# Patient Record
Sex: Female | Born: 1985
Health system: Southern US, Community
[De-identification: ages and names within clinical notes are randomized; demographics above are authoritative.]

## PROBLEM LIST (undated history)

## (undated) ENCOUNTER — Inpatient Hospital Stay (HOSPITAL_COMMUNITY): Payer: Self-pay

## (undated) DIAGNOSIS — F32A Depression, unspecified: Secondary | ICD-10-CM

## (undated) DIAGNOSIS — D259 Leiomyoma of uterus, unspecified: Secondary | ICD-10-CM

## (undated) DIAGNOSIS — F419 Anxiety disorder, unspecified: Secondary | ICD-10-CM

## (undated) DIAGNOSIS — M419 Scoliosis, unspecified: Secondary | ICD-10-CM

## (undated) HISTORY — DX: Leiomyoma of uterus, unspecified: D25.9

## (undated) HISTORY — DX: Anxiety disorder, unspecified: F41.9

## (undated) HISTORY — DX: Depression, unspecified: F32.A

---

## 2007-04-03 ENCOUNTER — Emergency Department (HOSPITAL_COMMUNITY): Admission: EM | Admit: 2007-04-03 | Discharge: 2007-04-03 | Payer: Self-pay | Admitting: Emergency Medicine

## 2009-06-19 ENCOUNTER — Emergency Department (HOSPITAL_COMMUNITY): Admission: EM | Admit: 2009-06-19 | Discharge: 2009-06-19 | Payer: Self-pay | Admitting: Family Medicine

## 2009-06-25 ENCOUNTER — Emergency Department (HOSPITAL_COMMUNITY): Admission: EM | Admit: 2009-06-25 | Discharge: 2009-06-25 | Payer: Self-pay | Admitting: Family Medicine

## 2009-06-27 ENCOUNTER — Ambulatory Visit: Payer: Self-pay | Admitting: Family Medicine

## 2009-06-27 DIAGNOSIS — M4120 Other idiopathic scoliosis, site unspecified: Secondary | ICD-10-CM | POA: Insufficient documentation

## 2009-06-27 DIAGNOSIS — J301 Allergic rhinitis due to pollen: Secondary | ICD-10-CM | POA: Insufficient documentation

## 2009-07-05 ENCOUNTER — Ambulatory Visit: Payer: Self-pay

## 2009-07-07 ENCOUNTER — Encounter: Admission: RE | Admit: 2009-07-07 | Discharge: 2009-07-07 | Payer: Self-pay | Admitting: Family Medicine

## 2009-07-07 ENCOUNTER — Encounter: Payer: Self-pay | Admitting: Family Medicine

## 2009-08-09 ENCOUNTER — Ambulatory Visit: Payer: Self-pay | Admitting: Family Medicine

## 2009-09-05 ENCOUNTER — Ambulatory Visit: Payer: Self-pay | Admitting: Family Medicine

## 2009-09-05 DIAGNOSIS — M542 Cervicalgia: Secondary | ICD-10-CM | POA: Insufficient documentation

## 2009-09-05 DIAGNOSIS — M62838 Other muscle spasm: Secondary | ICD-10-CM | POA: Insufficient documentation

## 2009-09-13 ENCOUNTER — Encounter: Admission: RE | Admit: 2009-09-13 | Discharge: 2009-11-03 | Payer: Self-pay | Admitting: Family Medicine

## 2009-09-14 ENCOUNTER — Encounter: Payer: Self-pay | Admitting: Family Medicine

## 2009-10-19 ENCOUNTER — Encounter: Payer: Self-pay | Admitting: Family Medicine

## 2009-11-02 ENCOUNTER — Encounter: Payer: Self-pay | Admitting: Family Medicine

## 2010-05-08 NOTE — Letter (Signed)
Summary: [facility] PT referral form  [facility] PT referral form   Imported By: Marily Memos 09/05/2009 13:36:14  _____________________________________________________________________  External Attachment:    Type:   Image     Comment:   External Document

## 2010-05-08 NOTE — Assessment & Plan Note (Signed)
Summary: f/up,tcb   Vital Signs:  Patient profile:   25 year old female Height:      62.5 inches Weight:      94.6 pounds BMI:     17.09 Temp:     98.0 degrees F oral Pulse rate:   60 / minute BP sitting:   120 / 76  (left arm) Cuff size:   regular  Vitals Entered By: Garen Grams LPN (Aug 09, 1608 1:41 PM) CC: ? allergies Is Patient Diabetic? No Pain Assessment Patient in pain? yes     Location: neck/shoulders   CC:  ? allergies.  History of Present Illness: Patient is here today for c/o neck and upper back stiffness and soreness.  She woke up on Sat morn (4 days ago) with muscle spasms in neck and back, no h/o trauma or heavy lifting.  She does have a h/o lumbar scoliosis (seen in sports med clinic). She tried heatback, OTC herbal muscle relaxer and topical anesthetic with minimal relief.    She is also here for f/u on her allergic rhinitis.  She states symptoms improved, but still c/o throat soreness believed to be due to postnasal drip.  She also c/o heartburn and belching which is new for patient - likely symptoms of GER which could also contribute to throat pain.  Habits & Providers  Alcohol-Tobacco-Diet     Tobacco Status: quit  Allergies: No Known Drug Allergies  Social History: Smoking Status:  quit  Review of Systems       The patient complains of hoarseness.  The patient denies anorexia, fever, weight loss, weight gain, decreased hearing, chest pain, dyspnea on exertion, prolonged cough, hemoptysis, abdominal pain, severe indigestion/heartburn, and difficulty walking.    Physical Exam  General:  Well-developed,well-nourished,in no acute distress; alert,appropriate and cooperative throughout examination Ears:  External ear exam shows no significant lesions or deformities.  Otoscopic examination reveals clear canals, tympanic membranes are intact bilaterally without bulging, retraction, inflammation or discharge. Hearing is grossly normal bilaterally. Nose:   External nasal examination shows no deformity or inflammation. Nasal mucosa are pink and moist without lesions or exudates. Mouth:  Oral mucosa and oropharynx without lesions or exudates. Mild erythema of pharynx. Teeth in good repair.  Neck:  No deformities, masses, or tenderness noted. Lungs:  Normal respiratory effort, chest expands symmetrically. Lungs are clear to auscultation, no crackles or wheezes. Heart:  Normal rate and regular rhythm. S1 and S2 normal without gallop, murmur, click, rub or other extra sounds. Msk:  Upper back with muscle spasm of bilat trapezius mm and rhomboids. Tender to palpation.   Impression & Recommendations:  Problem # 1:  MUSCLE SPASM, TRAPEZIUS (ICD-728.85) Will treat with short course of Flexeril. Recommend heat and massage. Orders: [facility]- Est  Level 4 (96045)  Problem # 2:  ESOPHAGEAL REFLUX (ICD-530.81) Could be contributing to throat discomfort.  Will start course of PPI x 8 weeks Her updated medication list for this problem includes:    Omeprazole 20 Mg Cpdr (Omeprazole) ..... One tab by mouth daily  Orders: [facility]- Est  Level 4 (99214)  Problem # 3:  ALLERGIC RHINITIS DUE TO POLLEN (ICD-477.0) Patient states symptoms somewhat improved on Singulair and Fluticasone. Insurance not likely to cover referral to Allergist for eval and injections, but patient will consider this and find out if insurance will cover referral.  Orders: [facility]- Est  Level 4 (40981)  Problem # 4:  SCOLIOSIS , IDIOPATHIC (ICD-737.30) Patient to schedule follow up with Sports  Med and will likely be referred for PT.  Orders: [facility]- Est  Level 4 (16109)  Complete Medication List: 1)  Zyrtec-d Allergy & Congestion 5-120 Mg Xr12h-tab (Cetirizine-pseudoephedrine) .... One tablet by mouth daily 2)  Singulair 10 Mg Tabs (Montelukast sodium) .... One tablet by mouth daily 3)  Neomycin-polymyxin-hc 3.5-10000-1 Susp (Neomycin-polymyxin-hc) 4)  Flonase 50 Mcg/act Susp (Fluticasone  propionate) .... 2 sprays each nostril daily 5)  Anusol-hc 2.5 % Crea (Hydrocortisone) 6)  Omeprazole 20 Mg Cpdr (Omeprazole) .... One tab by mouth daily 7)  Flexeril 5 Mg Tabs (Cyclobenzaprine hcl) .... One tab by mouth three times a day as needed muscle spasm Prescriptions: FLEXERIL 5 MG TABS (CYCLOBENZAPRINE HCL) one tab by mouth three times a day as needed muscle spasm  #30 x 0   Entered and Authorized by:   Jettie Pagan MD   Signed by:   Jettie Pagan MD on 08/09/2009   Method used:   Print then Give to Patient   RxID:   [id number] OMEPRAZOLE 20 MG CPDR (OMEPRAZOLE) one tab by mouth daily  #30 x 1   Entered and Authorized by:   Jettie Pagan MD   Signed by:   Jettie Pagan MD on 08/09/2009   Method used:   Print then Give to Patient   RxID:   [id number]

## 2010-05-08 NOTE — Miscellaneous (Signed)
Summary: PT Discharge/[facility]  PT Discharge/[facility]   Imported By: Lanelle Bal 11/07/2009 14:37:07  _____________________________________________________________________  External Attachment:    Type:   Image     Comment:   External Document

## 2010-05-08 NOTE — Assessment & Plan Note (Signed)
Summary: np,df   Vital Signs:  Patient profile:   25 year old female Height:      62.5 inches Weight:      95 pounds BMI:     17.16 BSA:     1.40 Temp:     98.2 degrees F Pulse rate:   91 / minute BP sitting:   129 / 82  Vitals Entered By: Jone Baseman CMA (June 27, 2009 8:36 AM) CC: New Patient Is Patient Diabetic? No Pain Assessment Patient in pain? no        CC:  New Patient.  History of Present Illness: Patient is here today to establish care and discuss chronic medical issues.  She was dx with Scoliosis at age 89 - never required bracing or limited any activities.  She was active in cheerleading during highschool and participated in sports.  However, since she started her new job, which requires a lot of bending and getting up and down, she has noticed an increase in her back pain.   She also has allergic rhinitis, not adequately controlled on OTC meds (Claritin, Zyrtec, Benadryl, Sudafed).  She started Fluticasone nasal spray 1 week ago, prescribed at urgent care. She has not noticed an improvement in her symptoms.  She also uses a NetiPot daily.  Allergic triggers are seasonal (hay fever). She does have a new pet in the home that could be contributing to her symptoms.  She was also recently treated for Swimmer's ear at urgent care - placed on Neomycin otic drops.  She states her ear pain is resolving.   Patient is also concerned about low weight, BMI 17.  She states she has tried gaining weight with protein shakes, eating more, but has been unable to do so.  Her mother is also very small framed, so this may be genetic. She is also concerned that her small size may contribute to frequent colds.  She had a recent pap in 03/2009 which was normal.  As well as negative GC/Chlam and HIV.  No h/o abnormal paps.  H/o Chlam in 2008 s/p treatment.  Habits & Providers  Alcohol-Tobacco-Diet     Tobacco Status: quit > 6 months     Tobacco Counseling: to remain off tobacco  products  Current Medications (verified): 1)  Zyrtec-D Allergy & Congestion 5-120 Mg Xr12h-Tab (Cetirizine-Pseudoephedrine) .... One Tablet By Mouth Daily 2)  Singulair 10 Mg Tabs (Montelukast Sodium) .... One Tablet By Mouth Daily 3)  Neomycin-Polymyxin-Hc 3.5-10000-1 Susp (Neomycin-Polymyxin-Hc) 4)  Flonase 50 Mcg/act Susp (Fluticasone Propionate) .... 2 Sprays Each Nostril Daily 5)  Anusol-Hc 2.5 % Crea (Hydrocortisone)  Allergies (verified): No Known Drug Allergies  Past History:  Past Medical History: Scoliosis - dx at age 85 Allergic Rhinitis Anal Fissure vs Hemorrhoids - dx 06/2009, resolving per patient Swimmer's Ear - dx 06/2009, resolving  Past Surgical History: None  Family History: Grandmother - died at 37 y/o, DM2, HTN  Social History: Quit smoking 02/2009 Admits to rare THC Denies ETOHSmoking Status:  quit > 6 months  Review of Systems  The patient denies anorexia, fever, hoarseness, chest pain, syncope, prolonged cough, abdominal pain, severe indigestion/heartburn, and unusual weight change.    Physical Exam  General:  Well-developed,well-nourished, thin, in no acute distress; alert,appropriate and cooperative throughout examination Ears:  External ear exam shows no significant lesions or deformities.  Otoscopic examination reveals canals partially occluded by cerumen, no erythema, inflammation or discharge. Hearing is grossly normal bilaterally. Nose:  External nasal examination shows no deformity  or inflammation. Nasal mucosa are boggy, edematous, turbinates bluish and swollen, L > R.  Mouth:  Oral mucosa and oropharynx without lesions or exudates. Pharynx mildly erythematous. Teeth in good repair. Neck:  No deformities, masses, or tenderness noted. Lungs:  Normal respiratory effort, chest expands symmetrically. Lungs are clear to auscultation, no crackles or wheezes. Heart:  Normal rate and regular rhythm. S1 and S2 normal without gallop, murmur, click, rub  or other extra sounds. Abdomen:  Bowel sounds positive,abdomen soft and non-tender without masses, organomegaly or hernias noted.   Impression & Recommendations:  Problem # 1:  ALLERGIC RHINITIS DUE TO POLLEN (ICD-477.0) Continue Fluticasone for 2-3 more weeks. Rx for Zyrtec D.  Will also start trial of Singulair.  If symptoms not improved, will try a different nasal steroid and/or oral antihistamine. Continue NetiPot. Avoid triggers.  Allergies could be contributing to frequent colds and irritated throat from post nasal drip.  Patient may try Echinacea, Zinc and Vitamin C at onset of cold symptoms.   Problem # 2:  SCOLIOSIS , IDIOPATHIC (ICD-737.30) Will refer to Sports Medicine clinic for evaluation.   Problem # 3:  ACUTE SWIMMERS EAR (ICD-380.12) Improving per patient.   Her updated medication list for this problem includes:    Neomycin-polymyxin-hc 3.5-10000-1 Susp (Neomycin-polymyxin-hc)  Problem # 4:  ANAL FISSURE (ICD-565.0) Fissure vs hemorrhoids? Exam deferred as patient states symptoms improving.  Still with some itching from time to time.  Advised to continue high fiber diet and drink lots of water to avoid constipation.  Patient reports 2 BMs daily, not hard or watery.   Complete Medication List: 1)  Zyrtec-d Allergy & Congestion 5-120 Mg Xr12h-tab (Cetirizine-pseudoephedrine) .... One tablet by mouth daily 2)  Singulair 10 Mg Tabs (Montelukast sodium) .... One tablet by mouth daily 3)  Neomycin-polymyxin-hc 3.5-10000-1 Susp (Neomycin-polymyxin-hc) 4)  Flonase 50 Mcg/act Susp (Fluticasone propionate) .... 2 sprays each nostril daily 5)  Anusol-hc 2.5 % Crea (Hydrocortisone)  Patient Instructions: 1)  Please make an appointment with Sports Medicine clinic for evaluation of your scoliosis. 2)  Call me in 1-2 weeks if you notice no improvement with the Fluticasone nasal spray or the start of your new medications. Prescriptions: SINGULAIR 10 MG TABS (MONTELUKAST SODIUM) one  tablet by mouth daily  #30 x 5   Entered and Authorized by:   Jettie Pagan MD   Signed by:   Jettie Pagan MD on 06/27/2009   Method used:   Print then Give to Patient   RxID:   [id number] ALLERGY & CONGESTION 5-120 MG XR12H-TAB (CETIRIZINE-PSEUDOEPHEDRINE) one tablet by mouth daily  #30 x 5   Entered and Authorized by:   Jettie Pagan MD   Signed by:   Jettie Pagan MD on 06/27/2009   Method used:   Print then Give to Patient   RxID:   [id number]

## 2010-05-08 NOTE — Assessment & Plan Note (Signed)
Summary: eval of scoliosis/per drapiza/eo   Vital Signs:  Patient profile:   25 year old female BP sitting:   118 / 56  Vitals Entered By: Lillia Pauls CMA (July 05, 2009 3:06 PM)  History of Present Illness: Stefanie Ortiz has a history of idiopathic scoliosis. No prior history of back trauma, bracing, surgery, or infections. No known history of congenital/musculoskeletal/neurologic disorders.  Has been asymptomatic until she recently began a new job. 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 New job requires prolonged sitting and bending at the waist. Notices non-radiating superior and inferior thoracic spinal pain after prolonged sitting/bending. Pain abates as she continues to ambulate. No pain on general ambulation. No radiation of pain. No extremity paresthesias. No incontinence. Denies neck/shoulder pain.  Allergies: No Known Drug Allergies PMH-FH-SH reviewed for relevance  Physical Exam  General:  Well-developed,well-nourished,in no acute distress; alert,appropriate and cooperative throughout examination Msk:  NECK: FROM without pain. No cervical ttp.  BACK: Dextroscoliosis with increased development of left para spinal musculature.  Markedly excessive lordosis.  No ttp.  Normal ROM at the waist w/o pain.  Normal scapulae function.  PELVIS: Slight antero-inferiorr positioning of left PSIS in comparison to right PSIS. Slightly decreased left SI jt motion.  KNEES: Full ROM/strength.  ANKLES/FEET: Pes planus with excessive pronation and multiple toe splaying. Full ROM/strength.  Pulses:  2+ dp pulses. Extremities:  LEG LENGTHS: LLE appears shorter on ambulation; w/o instablity. Equal leg lengths on supine positioning. LLE longer on sit-up maneuver. Neurologic:  1+ DTR all extremities. (-) sitting/supine SLR bilaterally (-) clonus bilaterally. (-) Babinski bilaterally.   Impression & Recommendations:  Problem  # 1:  SCOLIOSIS , IDIOPATHIC (ICD-737.30)  - Scoliosis x-rays. - Will refer for formal physical therapy once x-rays reviewed.  - RTC in 4-6 wks. - In the meantime, focus on frequent position changes at work given pain occurs at high-load areas of spine on prolonged sitting or bending at the waist.   Orders: Radiology other (Radiology Other)  Complete Medication List: 1)  Zyrtec-d Allergy & Congestion 5-120 Mg Xr12h-tab (Cetirizine-pseudoephedrine) .... One tablet by mouth daily 2)  Singulair 10 Mg Tabs (Montelukast sodium) .... One tablet by mouth daily 3)  Neomycin-polymyxin-hc 3.5-10000-1 Susp (Neomycin-polymyxin-hc) 4)  Flonase 50 Mcg/act Susp (Fluticasone propionate) .... 2 sprays each nostril daily 5)  Anusol-hc 2.5 % Crea (Hydrocortisone)  Appended Document: eval of scoliosis/per drapiza/eo correction leftward scoliosis on examination

## 2010-05-08 NOTE — Miscellaneous (Signed)
Summary: PT Initial Summary/[facility]  PT Initial [facility]   Imported By: Lanelle Bal 09/20/2009 13:48:45  _____________________________________________________________________  External Attachment:    Type:   Image     Comment:   External Document

## 2010-05-08 NOTE — Miscellaneous (Signed)
Summary: Renewal Summary for PT [facility]  Renewal Summary for PT [facility]   Imported By: Maryln Gottron 10/24/2009 15:32:12  _____________________________________________________________________  External Attachment:    Type:   Image     Comment:   External Document

## 2010-05-08 NOTE — Assessment & Plan Note (Signed)
Summary: F/U,[facility]   History of Present Illness: 25 year old female with significant 22 degree lumbar scoliosis presents for follow-up with ongoing upper shoulder pain as well.  Trapezius spasm:  Since her job started, has been having some issues with her back. Regardless of what she is doing with her back, neck and shoulders have really been bothering her.   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 Works at [employer] and will do a --- works for good Engineer, site. Moving around all the time and repetitively bending at work.   Tried some flexeril  Allergies: No Known Drug Allergies  Past History:  Past medical, surgical, family and social histories (including risk factors) reviewed, and no changes noted (except as noted below).  Past Medical History: Reviewed history from 06/27/2009 and no changes required. Scoliosis - dx at age 34 Allergic Rhinitis Anal Fissure vs Hemorrhoids - dx 06/2009, resolving per patient Swimmer's Ear - dx 06/2009, resolving  Past Surgical History: Reviewed history from 06/27/2009 and no changes required. None  Family History: Reviewed history from 06/27/2009 and no changes required. Grandmother - died at 50 y/o, DM2, HTN  Social History: Reviewed history from 06/27/2009 and no changes required. Quit smoking 02/2009 Admits to rare THC Denies ETOH  Review of Systems       REVIEW OF SYSTEMS  GEN: No systemic complaints, no fevers, chills, sweats, or other acute illnesses MSK: Detailed in the HPI GI: tolerating PO intake without difficulty Neuro: No numbness, parasthesias, or tingling associated. Otherwise the pertinent positives of the ROS are noted above.    Physical Exam  General:  GEN: Well-developed,well-nourished,in no acute distress; alert,appropriate and cooperative throughout examination HEENT: Normocephalic and atraumatic without obvious abnormalities. No apparent  alopecia or balding. Ears, externally no deformities PULM: Breathing comfortably in no respiratory distress EXT: No clubbing, cyanosis, or edema PSYCH: Normally interactive. Cooperative during the interview. Pleasant. Friendly and conversant. Not anxious or depressed appearing. Normal, full affect.  Msk:  Upper back with muscle spasm of bilat trapezius mm and rhomboids. Tender to palpation.  Cervical ROM in all directions WNL TTP paracervical, traps, and rhomboids forward sloping scapulas  Notable scoliosis and lordosis  NT at paraspinus, lumbar   Impression & Recommendations:  Problem # 1:  CERVICALGIA (ICD-723.1) Assessment New >25 minutes spent in face to face time with patient, >50% spent in counselling or coordination of care: believe this is all from repetitive postural motions from work with overemphasized posterior cervical support and scapular support.  Patient is weak, <100 pounds. Given HEP and reviewed PT for neck, scapular stability, core program - will need long term continued work with this.  f/u 2-3 mo  Her updated medication list for this problem includes:    Flexeril 5 Mg Tabs (Cyclobenzaprine hcl) ..... One tab by mouth three times a day as needed muscle spasm  Problem # 2:  MUSCLE SPASM, TRAPEZIUS (ICD-728.85) Assessment: New  Problem # 3:  SCOLIOSIS , IDIOPATHIC (ICD-737.30) Films reviewed with the patient.  Complete Medication List: 1)  Zyrtec-d Allergy & Congestion 5-120 Mg Xr12h-tab (Cetirizine-pseudoephedrine) .... One tablet by mouth daily 2)  Singulair 10 Mg Tabs (Montelukast sodium) .... One tablet by mouth daily 3)  Neomycin-polymyxin-hc 3.5-10000-1 Susp (Neomycin-polymyxin-hc) 4)  Flonase 50 Mcg/act Susp (Fluticasone propionate) .... 2 sprays each nostril daily 5)  Anusol-hc 2.5 % Crea (Hydrocortisone) 6)  Omeprazole 20 Mg Cpdr (Omeprazole) .... One  tab by mouth daily 7)  Flexeril 5 Mg Tabs (Cyclobenzaprine hcl) .... One tab by mouth three  times a day as needed muscle spasm

## 2010-07-01 LAB — TSH: TSH: 1.704 u[IU]/mL (ref 0.350–4.500)

## 2010-07-01 LAB — POCT RAPID STREP A (OFFICE): Streptococcus, Group A Screen (Direct): NEGATIVE

## 2011-01-11 LAB — I-STAT 8, (EC8 V) (CONVERTED LAB)
Acid-Base Excess: 2
BUN: 11
Bicarbonate: 26.7 — ABNORMAL HIGH
Chloride: 102
Glucose, Bld: 107 — ABNORMAL HIGH
HCT: 44
Hemoglobin: 15
Operator id: 284141
Potassium: 3.7
Sodium: 134 — ABNORMAL LOW
TCO2: 28
pCO2, Ven: 41.9 — ABNORMAL LOW
pH, Ven: 7.412 — ABNORMAL HIGH

## 2011-01-11 LAB — URINALYSIS, ROUTINE W REFLEX MICROSCOPIC
Glucose, UA: NEGATIVE
Urobilinogen, UA: 1
pH: 6

## 2011-01-11 LAB — CBC
HCT: 38.4
Hemoglobin: 12.3
MCV: 70.1 — ABNORMAL LOW
WBC: 18.4 — ABNORMAL HIGH

## 2011-01-11 LAB — DIFFERENTIAL
Basophils Relative: 0
Eosinophils Absolute: 0
Eosinophils Relative: 0
Lymphocytes Relative: 7 — ABNORMAL LOW
Monocytes Absolute: 2.4 — ABNORMAL HIGH
Neutrophils Relative %: 80 — ABNORMAL HIGH

## 2011-01-11 LAB — POCT I-STAT CREATININE
Creatinine, Ser: 1
Operator id: 284141

## 2011-01-11 LAB — GC/CHLAMYDIA PROBE AMP, GENITAL
Chlamydia, DNA Probe: POSITIVE — AB
GC Probe Amp, Genital: NEGATIVE

## 2011-01-11 LAB — POCT PREGNANCY, URINE: Operator id: 27065

## 2011-05-06 ENCOUNTER — Encounter: Payer: Self-pay | Admitting: Advanced Practice Midwife

## 2012-10-29 ENCOUNTER — Other Ambulatory Visit: Payer: Self-pay | Admitting: Family Medicine

## 2012-10-29 DIAGNOSIS — L989 Disorder of the skin and subcutaneous tissue, unspecified: Secondary | ICD-10-CM

## 2012-11-04 ENCOUNTER — Ambulatory Visit
Admission: RE | Admit: 2012-11-04 | Discharge: 2012-11-04 | Disposition: A | Discharge status: Home / Self Care | Payer: BC Managed Care – PPO | Source: Ambulatory Visit | Attending: Family Medicine | Admitting: Family Medicine

## 2012-11-04 DIAGNOSIS — L989 Disorder of the skin and subcutaneous tissue, unspecified: Secondary | ICD-10-CM

## 2012-11-10 ENCOUNTER — Encounter (INDEPENDENT_AMBULATORY_CARE_PROVIDER_SITE_OTHER): Payer: Self-pay | Admitting: Surgery

## 2012-11-13 ENCOUNTER — Other Ambulatory Visit (HOSPITAL_COMMUNITY)
Admission: RE | Admit: 2012-11-13 | Discharge: 2012-11-13 | Disposition: A | Discharge status: Home / Self Care | Payer: BC Managed Care – PPO | Source: Ambulatory Visit | Attending: Family Medicine | Admitting: Family Medicine

## 2012-11-13 DIAGNOSIS — Z124 Encounter for screening for malignant neoplasm of cervix: Secondary | ICD-10-CM | POA: Insufficient documentation

## 2012-11-16 ENCOUNTER — Other Ambulatory Visit: Payer: Self-pay | Admitting: Family Medicine

## 2012-11-23 ENCOUNTER — Encounter (INDEPENDENT_AMBULATORY_CARE_PROVIDER_SITE_OTHER): Payer: Self-pay | Admitting: Surgery

## 2012-11-23 ENCOUNTER — Ambulatory Visit (INDEPENDENT_AMBULATORY_CARE_PROVIDER_SITE_OTHER): Payer: BC Managed Care – PPO | Admitting: Surgery

## 2012-11-23 ENCOUNTER — Telehealth (INDEPENDENT_AMBULATORY_CARE_PROVIDER_SITE_OTHER): Payer: Self-pay | Admitting: Surgery

## 2012-11-23 VITALS — BP 104/62 | HR 72 | Resp 16 | Ht 63.0 in | Wt 109.2 lb

## 2012-11-23 DIAGNOSIS — R2231 Localized swelling, mass and lump, right upper limb: Secondary | ICD-10-CM

## 2012-11-23 DIAGNOSIS — R229 Localized swelling, mass and lump, unspecified: Secondary | ICD-10-CM

## 2012-11-23 DIAGNOSIS — R2230 Localized swelling, mass and lump, unspecified upper limb: Secondary | ICD-10-CM | POA: Insufficient documentation

## 2012-11-23 NOTE — Progress Notes (Signed)
Patient ID: Stefanie Ortiz, female   DOB: 1985-08-28, 27 y.o.   MRN: [id number]  Chief Complaint  Patient presents with  . Other    arm mass    HPI Stefanie Ortiz is a 27 y.o. female.   HPI This is a very pleasant female referred by Dr.Nnodi for an evaluation of a mass on her right shoulder/deltoid area. The mass is been present for lease 4 or 5 years but is now getting larger and causing significant shoulder discomfort especially with her work and repetitive motions. She will have a dull aching discomfort at the site most of the day. It has gone from minimally palpable 2 more than 5 cm in this time span. She has no paresthesias down the arm. She is otherwise without complaints. History reviewed. No pertinent past medical history.  History reviewed. No pertinent past surgical history.  History reviewed. No pertinent family history.  Social History History  Substance Use Topics  . Smoking status: Former Games developer  . Smokeless tobacco: Never Used  . Alcohol Use: Yes     Comment: rare    No Known Allergies  Current Outpatient Prescriptions  Medication Sig Dispense Refill  . cyclobenzaprine (FLEXERIL) 10 MG tablet Take 10 mg by mouth 3 (three) times daily as needed for muscle spasms.      Marland Kitchen ibuprofen (ADVIL,MOTRIN) 600 MG tablet Take 600 mg by mouth every 6 (six) hours as needed for pain.       No current facility-administered medications for this visit.    Review of Systems Review of Systems  Constitutional: Negative for fever, chills and unexpected weight change.  HENT: Negative for hearing loss, congestion, sore throat, trouble swallowing and voice change.   Eyes: Negative for visual disturbance.  Respiratory: Negative for cough and wheezing.   Cardiovascular: Negative for chest pain, palpitations and leg swelling.  Gastrointestinal: Negative for nausea, vomiting, abdominal pain, diarrhea, constipation, blood in stool, abdominal distention and anal bleeding.   Genitourinary: Negative for hematuria, vaginal bleeding and difficulty urinating.  Musculoskeletal: Positive for back pain. Negative for arthralgias.  Skin: Negative for rash and wound.  Neurological: Negative for seizures, syncope and headaches.  Hematological: Negative for adenopathy. Does not bruise/bleed easily.  Psychiatric/Behavioral: Negative for confusion.    Blood pressure 104/62, pulse 72, resp. rate 16, height 5\' 3"  (1.6 m), weight 109 lb 3.2 oz (49.533 kg).  Physical Exam Physical Exam  Constitutional: She is oriented to person, place, and time. She appears well-developed and well-nourished. No distress.  HENT:  Head: Atraumatic.  Right Ear: External ear normal.  Left Ear: External ear normal.  Nose: Nose normal.  Mouth/Throat: No oropharyngeal exudate.  Eyes: Conjunctivae are normal. Pupils are equal, round, and reactive to light. Right eye exhibits no discharge. Left eye exhibits no discharge. No scleral icterus.  Neck: Normal range of motion. Neck supple. No tracheal deviation present.  Cardiovascular: Normal rate, regular rhythm, normal heart sounds and intact distal pulses.   No murmur heard. Pulmonary/Chest: Effort normal and breath sounds normal. No respiratory distress. She has no wheezes.  Musculoskeletal: Normal range of motion. She exhibits no edema and no tenderness.  There is a 5 cm soft, slightly mobile mass at the right anterior/lateral deltoid muscle of the right upper arm   Lymphadenopathy:    She has no cervical adenopathy.    She has no axillary adenopathy.  Neurological: She is alert and oriented to person, place, and time.  Skin: Skin is warm and dry. No rash noted.  She is not diaphoretic. No erythema.  Psychiatric: Her behavior is normal. Judgment normal.    Data Reviewed She has an ultrasound of the shoulder mass demonstrating a 5 cm x 5.3 cm subcutaneous mass  Assessment    5 cm right upper arm/shoulder mass     Plan    As she is  symptomatic with pain and this is getting larger, removal is recommended for both pain control and histologic evaluation to rule out malignancy. I discussed this with her in detail. I discussed the risks of surgery which includes is not limited to bleeding, infection, recurrence, need for further surgery, et Karie Soda. She understands and wishes to proceed. Surgery will be scheduled        Deanie Jupiter A 11/23/2012, 9:25 AM

## 2012-11-23 NOTE — Telephone Encounter (Signed)
Discussed pt financial responsibilities pt will call back to schedule. Placed in pending folder.

## 2015-07-27 ENCOUNTER — Ambulatory Visit (INDEPENDENT_AMBULATORY_CARE_PROVIDER_SITE_OTHER): Payer: BLUE CROSS/BLUE SHIELD | Admitting: Physician Assistant

## 2015-07-27 VITALS — BP 109/67 | HR 90 | Temp 99.4°F | Resp 16 | Ht 63.0 in | Wt 101.0 lb

## 2015-07-27 DIAGNOSIS — J358 Other chronic diseases of tonsils and adenoids: Secondary | ICD-10-CM | POA: Diagnosis not present

## 2015-07-27 DIAGNOSIS — H6123 Impacted cerumen, bilateral: Secondary | ICD-10-CM

## 2015-07-27 DIAGNOSIS — J039 Acute tonsillitis, unspecified: Secondary | ICD-10-CM

## 2015-07-27 LAB — POCT RAPID STREP A (OFFICE): Rapid Strep A Screen: NEGATIVE

## 2015-07-27 MED ORDER — AMOXICILLIN 875 MG PO TABS: 875.0000 mg | ORAL_TABLET | Freq: Two times a day (BID) | ORAL | Status: AC

## 2015-07-27 NOTE — Progress Notes (Signed)
[facility] [address], [place] [address] [phone]  Date:  07/27/2015   Name:  Stefanie Ortiz   DOB:  March 28, 1986   MRN:  [id number]  PCP:  Gavin Pound, MD    Chief Complaint: Sore Throat; Fever; and Ear Pain   History of Present Illness:  This is a 30 y.o. female with PMH allergic rhinitis who is presenting with 3 days of sore throat, fever and bilateral otalgia. Throat pain is worse on the right side. R ear hurting worse than L. Checked temp last night and was 100. Here her temp is 99.4. Having a dry cough. Denies nasal congestion.  Aggravating/alleviating factors: has been taking ibuprofen. Has not taking anything yet today. History of asthma: no History of env allergies: yes, but not taking anything. States she hasn't needed anything the past 3 years. Has noticed her allergies are worse this year and is thinking she may need to start taking an allergy med. 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   Review of Systems:  Review of Systems See HPI  Patient Active Problem List   Diagnosis Date Noted  . Arm mass 11/23/2012  . CERVICALGIA 09/05/2009  . MUSCLE SPASM, TRAPEZIUS 09/05/2009  . ALLERGIC RHINITIS DUE TO POLLEN 06/27/2009  . SCOLIOSIS , IDIOPATHIC 06/27/2009    Prior to Admission medications   Not on File    No Known Allergies  History reviewed. No pertinent past surgical history.  Social History  Substance Use Topics  . Smoking status: Former Research scientist (life sciences)  . Smokeless tobacco: Never Used  . Alcohol Use: Yes     Comment: rare    History reviewed. No pertinent family history.  Medication list has been reviewed and updated.  Physical Examination:  Physical Exam  Constitutional: She is oriented to person, place, and time. She appears well-developed and well-nourished. No distress.  HENT:  Head: Normocephalic and atraumatic.  Right Ear: Hearing, external ear and ear canal normal.  Left Ear: Hearing,  external ear and ear canal normal.  Nose: Nose normal.  Mouth/Throat: Uvula is midline and mucous membranes are normal. Oropharyngeal exudate (right tonsil), posterior oropharyngeal edema (bilateral tonsils 2+) and posterior oropharyngeal erythema present. No tonsillar abscesses.  Bilateral TMs blocked by cerumen. Lavage performed bilaterally. TMs clear.  Eyes: Conjunctivae and lids are normal. Right eye exhibits no discharge. Left eye exhibits no discharge. No scleral icterus.  Cardiovascular: Normal rate, regular rhythm, normal heart sounds and normal pulses.   No murmur heard. Pulmonary/Chest: Effort normal and breath sounds normal. No respiratory distress. She has no wheezes. She has no rhonchi. She has no rales.  Musculoskeletal: Normal range of motion.  Lymphadenopathy:       Head (right side): No submental, no submandibular and no tonsillar adenopathy present.       Head (left side): No submental, no submandibular and no tonsillar adenopathy present.    She has cervical adenopathy (right, anterior).  Neurological: She is alert and oriented to person, place, and time.  Skin: Skin is warm, dry and intact. No lesion and no rash noted.  Psychiatric: She has a normal mood and affect. Her speech is normal and behavior is normal. Thought content normal.   BP 109/67 mmHg  Pulse 90  Temp(Src) 99.4 F (37.4 C)  Resp 16  Ht 5\' 3"  (1.6 m)  Wt 101 lb (45.813 kg)  BMI 17.90 kg/m2  LMP 07/04/2015  Results for orders placed or performed  in visit on 07/27/15  POCT rapid strep A  Result Value Ref Range   Rapid Strep A Screen Negative Negative   Assessment and Plan:  1. Acute tonsillitis, unspecified etiology 2. Tonsillar exudate Rapid strep negative, culture pending. Will cover with amox d/t low grade fever and tonsillar exudate. If culture negative, will have pt stop amox. Discussed supportive care. Return in 1 week if symptoms do not improve or at any time if symptoms worsen.  -  amoxicillin (AMOXIL) 875 MG tablet; Take 1 tablet (875 mg total) by mouth 2 (two) times daily.  Dispense: 20 tablet; Refill: 0 - POCT rapid strep A - Culture, Group A Strep  3. Cerumen impaction, bilateral Lavage performed bilaterally, successful.   Benjaman Pott Drenda Freeze, MHS [facility]  07/27/2015

## 2015-07-27 NOTE — Patient Instructions (Addendum)
Take amoxicillin twice a day for 10 days. I will call you with the results of your throat culture -- if negative for strep I will let you know to stop the antibiotic. Drink plenty of water (64 oz/day) and get plenty of rest. Ibuprofen/tylenol for throat pain. Hot tea and cough drops can help. If your symptoms are not improving in 7 days or if at any time your symptoms worsen, return to clinic.     IF you received an x-ray today, you will receive an invoice from [facility]. Please contact [facility] Radiology at [phone] with questions or concerns regarding your invoice.   IF you received labwork today, you will receive an invoice from [employer]. Please contact [facility] at [phone] with questions or concerns regarding your invoice.   Our billing staff will not be able to assist you with questions regarding bills from these companies.  You will be contacted with the lab results as soon as they are available. The fastest way to get your results is to activate your My Chart account. Instructions are located on the last page of this paperwork. If you have not heard from [place] regarding the results in 2 weeks, please contact this office.

## 2015-07-28 ENCOUNTER — Telehealth: Payer: Self-pay

## 2015-07-28 LAB — CULTURE, GROUP A STREP: ORGANISM ID, BACTERIA: NORMAL

## 2015-07-28 NOTE — Telephone Encounter (Signed)
Pt is wanting to get something stronger for  Throat pain please call patient sister at [phone] pt can not talk

## 2015-08-01 MED ORDER — FLUCONAZOLE 150 MG PO TABS: 150.0000 mg | ORAL_TABLET | Freq: Once | ORAL | Status: DC

## 2015-08-01 NOTE — Telephone Encounter (Signed)
Spoke with pt, advised her to stop taking the medication because her throat culture was negative. I sent in Diflucan for yeast infection.

## 2015-08-01 NOTE — Telephone Encounter (Signed)
Pt states she called the other day and still hasn't heard from anyone. Doesn't need the pain medicine anymore because she is feeling better but did need to know if she should finish her medicine And also have a yeast infection from the antibiotic. Please call Daphne

## 2015-11-03 ENCOUNTER — Telehealth: Payer: Self-pay

## 2015-11-03 ENCOUNTER — Ambulatory Visit (INDEPENDENT_AMBULATORY_CARE_PROVIDER_SITE_OTHER): Payer: BLUE CROSS/BLUE SHIELD | Admitting: Urgent Care

## 2015-11-03 ENCOUNTER — Other Ambulatory Visit: Payer: Self-pay | Admitting: Urgent Care

## 2015-11-03 VITALS — BP 110/66 | HR 60 | Temp 98.6°F | Resp 18 | Ht 63.0 in | Wt 97.8 lb

## 2015-11-03 DIAGNOSIS — H9201 Otalgia, right ear: Secondary | ICD-10-CM

## 2015-11-03 DIAGNOSIS — H60391 Other infective otitis externa, right ear: Secondary | ICD-10-CM

## 2015-11-03 MED ORDER — CIPROFLOXACIN-HYDROCORTISONE 0.2-1 % OT SUSP: 3.0000 [drp] | Freq: Two times a day (BID) | OTIC | 0 refills | Status: AC

## 2015-11-03 MED ORDER — KETOROLAC TROMETHAMINE 60 MG/2ML IM SOLN
60.0000 mg | Freq: Once | INTRAMUSCULAR | Status: AC
  Administered 2015-11-03: 60 mg via INTRAMUSCULAR

## 2015-11-03 MED ORDER — TRAMADOL HCL 50 MG PO TABS: 50.0000 mg | ORAL_TABLET | Freq: Three times a day (TID) | ORAL | 0 refills | Status: DC | PRN

## 2015-11-03 NOTE — Telephone Encounter (Signed)
Pt notified.  Bess Harvest also added Tramadol.  Tramdol faxed.

## 2015-11-03 NOTE — Progress Notes (Signed)
    MRN: [id number] DOB: 1986/02/09  Subjective:   Stefanie Ortiz is a 30 y.o. female presenting for chief complaint of Ear Pain (right)  Reports 1 week history of worsening right ear pain, decreased hearing, right side facial pain, upper cervical lymph node neck pain. She tried otc ear drops without any relief. Denies fever, ear drainage, sinus pain, sore throat, cough.  Stefanie Ortiz currently has no medications in their medication list. Also has No Known Allergies.  Stefanie Ortiz  has no past medical history on file. Also  has no past surgical history on file.  Objective:   Vitals: BP 110/66   Pulse 60   Temp 98.6 F (37 C) (Oral)   Resp 18   Ht 5\' 3"  (1.6 m)   Wt 97 lb 12.8 oz (44.4 kg)   LMP 10/13/2015   SpO2 100%   BMI 17.32 kg/m   Physical Exam  Constitutional: She is oriented to person, place, and time. She appears well-developed and well-nourished.  HENT:  Right tragus tenderness, white clumpy discharge at distal end of ear canal, marked swelling, right TM not visualized. Left TM intact, no erythema, drainage. Nasal turbinates pink and moist without sinus tenderness. Throat without oropharyngeal exudates, erythema or abscesses.  Cardiovascular: Normal rate.   Pulmonary/Chest: Effort normal.  Neurological: She is alert and oriented to person, place, and time.   An ear wick was placed into the patient's right ear and 5 drops of neomycin-HC otic were placed. The ear wick remained in place.  Assessment and Plan :   1. Otitis, externa, infective, right 2. Ear pain, right - Start cipro-HC otic, patient will rtc if ear wick falls out of place. RTC in 3 days if no improvement.  Jaynee Eagles, PA-C [facility] [phone] 11/03/2015 10:07 AM

## 2015-11-03 NOTE — Telephone Encounter (Signed)
Called her pharmacy, Cortisporin HC does not require prior auth. We will use this medication instead.

## 2015-11-03 NOTE — Telephone Encounter (Signed)
Pt was seen today for ear pain and the medication is needing prior autho but pt cant wait for authorization and is hoping that we can change meds   Best number [phone]

## 2015-11-03 NOTE — Patient Instructions (Addendum)
Otitis Externa Otitis externa is a bacterial or fungal infection of the outer ear canal. This is the area from the eardrum to the outside of the ear. Otitis externa is sometimes called "swimmer's ear." CAUSES  Possible causes of infection include:  Swimming in dirty water.  Moisture remaining in the ear after swimming or bathing.  Mild injury (trauma) to the ear.  Objects stuck in the ear (foreign body).  Cuts or scrapes (abrasions) on the outside of the ear. SIGNS AND SYMPTOMS  The first symptom of infection is often itching in the ear canal. Later signs and symptoms may include swelling and redness of the ear canal, ear pain, and yellowish-white fluid (pus) coming from the ear. The ear pain may be worse when pulling on the earlobe. DIAGNOSIS  Your health care provider will perform a physical exam. A sample of fluid may be taken from the ear and examined for bacteria or fungi. TREATMENT  Antibiotic ear drops are often given for 10 to 14 days. Treatment may also include pain medicine or corticosteroids to reduce itching and swelling. HOME CARE INSTRUCTIONS   Apply antibiotic ear drops to the ear canal as prescribed by your health care provider.  Take medicines only as directed by your health care provider.  If you have diabetes, follow any additional treatment instructions from your health care provider.  Keep all follow-up visits as directed by your health care provider. PREVENTION   Keep your ear dry. Use the corner of a towel to absorb water out of the ear canal after swimming or bathing.  Avoid scratching or putting objects inside your ear. This can damage the ear canal or remove the protective wax that lines the canal. This makes it easier for bacteria and fungi to grow.  Avoid swimming in lakes, polluted water, or poorly chlorinated pools.  You may use ear drops made of rubbing alcohol and vinegar after swimming. Combine equal parts of white vinegar and alcohol in a bottle.  Put 3 or 4 drops into each ear after swimming. SEEK MEDICAL CARE IF:   You have a fever.  Your ear is still red, swollen, painful, or draining pus after 3 days.  Your redness, swelling, or pain gets worse.  You have a severe headache.  You have redness, swelling, pain, or tenderness in the area behind your ear. MAKE SURE YOU:   Understand these instructions.  Will watch your condition.  Will get help right away if you are not doing well or get worse.   This information is not intended to replace advice given to you by your health care provider. Make sure you discuss any questions you have with your health care provider.   Document Released: 03/25/2005 Document Revised: 04/15/2014 Document Reviewed: 04/11/2011 Elsevier Interactive Patient Education 2016 [employer].     IF you received an x-ray today, you will receive an invoice from [facility]. Please contact [facility]iology at [phone] with questions or concerns regarding your invoice.   IF you received labwork today, you will receive an invoice from [employer]. Please contact [facility] at [phone] with questions or concerns regarding your invoice.   Our billing staff will not be able to assist you with questions regarding bills from these companies.  You will be contacted with the lab results as soon as they are available. The fastest way to get your results is to activate your My Chart account. Instructions are located on the last page of this paperwork. If you have  not heard from [place] regarding the results in 2 weeks, please contact this office.

## 2015-11-15 ENCOUNTER — Ambulatory Visit (INDEPENDENT_AMBULATORY_CARE_PROVIDER_SITE_OTHER): Payer: BLUE CROSS/BLUE SHIELD | Admitting: Physician Assistant

## 2015-11-15 VITALS — BP 102/64 | HR 70 | Temp 97.8°F | Ht 63.0 in | Wt 96.0 lb

## 2015-11-15 DIAGNOSIS — H60393 Other infective otitis externa, bilateral: Secondary | ICD-10-CM | POA: Diagnosis not present

## 2015-11-15 MED ORDER — NEOMYCIN-POLYMYXIN-HC 3.5-10000-1 OT SOLN: 4.0000 [drp] | Freq: Four times a day (QID) | OTIC | 0 refills | Status: DC

## 2015-11-15 NOTE — Progress Notes (Signed)
[facility] [address] [place] [address] [phone]  Date:  11/15/2015   Name:  Stefanie Ortiz   DOB:  1985-09-04   MRN:  [id number]  PCP:  Gavin Pound, MD    Chief Complaint: Follow-up (RIGHT AND LEFT)   History of Present Illness:  This is a 30 y.o. female who is presenting for follow up right otitis externa, diagnosed here 11/03/15. States overall pain is better. Was having off and on pain still. Today no pain. She is starting to noticed some pain in her left ear now. She started using same drops in left ear 2 days ago. Pain is getting some better in left ear since started. She is noticing that she is having trouble hearing out of both ears now. Denies fever, chills, malaise, nasal congestion, sore throat. No drainage. She has been using cipro HC drops as directed.  Review of Systems:  Review of Systems See HPI  Patient Active Problem List   Diagnosis Date Noted  . Arm mass 11/23/2012  . CERVICALGIA 09/05/2009  . MUSCLE SPASM, TRAPEZIUS 09/05/2009  . ALLERGIC RHINITIS DUE TO POLLEN 06/27/2009  . SCOLIOSIS , IDIOPATHIC 06/27/2009    Prior to Admission medications   Medication Sig Start Date End Date Taking? Authorizing Provider  traMADol (ULTRAM) 50 MG tablet Take 1 tablet (50 mg total) by mouth every 8 (eight) hours as needed. 11/03/15  Yes Jaynee Eagles, PA-C    No Known Allergies  History reviewed. No pertinent surgical history.  Social History  Substance Use Topics  . Smoking status: Former Research scientist (life sciences)  . Smokeless tobacco: Never Used  . Alcohol use Yes     Comment: rare    History reviewed. No pertinent family history.  Medication list has been reviewed and updated.  Physical Examination:  Physical Exam  Constitutional: She is oriented to person, place, and time. She appears well-developed and well-nourished. No distress.  HENT:  Head: Normocephalic and atraumatic.  Right Ear: Hearing, external ear and ear canal normal.  Left  Ear: Hearing normal.  Nose: Nose normal.  Bilateral canals obstructed by purulent material. Purulence removed by ear lavage. TMs intact. Canals erythematous and swollen  Eyes: Conjunctivae and lids are normal. Right eye exhibits no discharge. Left eye exhibits no discharge. No scleral icterus.  Pulmonary/Chest: Effort normal. No respiratory distress.  Musculoskeletal: Normal range of motion.  Neurological: She is alert and oriented to person, place, and time.  Skin: Skin is warm, dry and intact. No lesion and no rash noted.  Psychiatric: She has a normal mood and affect. Her speech is normal and behavior is normal. Thought content normal.   BP 102/64 (BP Location: Right Arm, Patient Position: Sitting, Cuff Size: Small)   Pulse 70   Temp 97.8 F (36.6 C) (Oral)   Ht 5\' 3"  (1.6 m)   Wt 96 lb (43.5 kg)   LMP 11/15/2015   SpO2 99%   BMI 17.01 kg/m   Assessment and Plan:  1. Otitis, externa, infective, bilateral Ear lavage removed all purulent material. TMs clear. Switch from cipro to cortisporin. If symptoms not improved in 1 week, refer to ENT. - neomycin-polymyxin-hydrocortisone (CORTISPORIN) otic solution; Place 4 drops into both ears 4 (four) times daily.  Dispense: 10 mL; Refill: 0   Benjaman Pott. Drenda Freeze, MHS [facility]  11/15/2015

## 2015-11-15 NOTE — Patient Instructions (Addendum)
Apply 4 drops in each ear 4 times a day for 7 days. Do not stick anything in your ear. Try to keep ears dry. Return if your symptoms do not improve after 7 days or at any time if your symptoms worsen    IF you received an x-ray today, you will receive an invoice from [facility] Rad[facility]. Please contact [facility] Radiology at [phone] with questions or concerns regarding your invoice.   IF you received labwork today, you will receive an invoice from [employer]. Please contact [facility] at [phone] with questions or concerns regarding your invoice.   Our billing staff will not be able to assist you with questions regarding bills from these companies.  You will be contacted with the lab results as soon as they are available. The fastest way to get your results is to activate your My Chart account. Instructions are located on the last page of this paperwork. If you have not heard from [place] regarding the results in 2 weeks, please contact this office.

## 2016-11-29 ENCOUNTER — Encounter (HOSPITAL_COMMUNITY): Payer: Self-pay | Admitting: *Deleted

## 2016-11-29 DIAGNOSIS — R350 Frequency of micturition: Secondary | ICD-10-CM | POA: Insufficient documentation

## 2016-11-29 DIAGNOSIS — K7689 Other specified diseases of liver: Secondary | ICD-10-CM | POA: Insufficient documentation

## 2016-11-29 DIAGNOSIS — Z87891 Personal history of nicotine dependence: Secondary | ICD-10-CM | POA: Insufficient documentation

## 2016-11-29 DIAGNOSIS — D259 Leiomyoma of uterus, unspecified: Secondary | ICD-10-CM | POA: Insufficient documentation

## 2016-11-29 DIAGNOSIS — N83209 Unspecified ovarian cyst, unspecified side: Secondary | ICD-10-CM | POA: Insufficient documentation

## 2016-11-29 LAB — URINALYSIS, ROUTINE W REFLEX MICROSCOPIC
Bilirubin Urine: NEGATIVE
GLUCOSE, UA: NEGATIVE mg/dL
HGB URINE DIPSTICK: NEGATIVE
Ketones, ur: NEGATIVE mg/dL
Leukocytes, UA: NEGATIVE
Nitrite: NEGATIVE
PH: 7.5 (ref 5.0–8.0)
Protein, ur: NEGATIVE mg/dL
SPECIFIC GRAVITY, URINE: 1.005 (ref 1.005–1.030)

## 2016-11-29 LAB — COMPREHENSIVE METABOLIC PANEL
ALBUMIN: 3.8 g/dL (ref 3.5–5.0)
ALK PHOS: 119 U/L (ref 38–126)
ALT: 36 U/L (ref 14–54)
AST: 40 U/L (ref 15–41)
Anion gap: 5 (ref 5–15)
BILIRUBIN TOTAL: 0.5 mg/dL (ref 0.3–1.2)
BUN: 11 mg/dL (ref 6–20)
CALCIUM: 9.1 mg/dL (ref 8.9–10.3)
CO2: 27 mmol/L (ref 22–32)
CREATININE: 0.78 mg/dL (ref 0.44–1.00)
Chloride: 105 mmol/L (ref 101–111)
GFR calc non Af Amer: >60 mL/min (ref >60)
GLUCOSE: 99 mg/dL (ref 65–99)
Potassium: 3.9 mmol/L (ref 3.5–5.1)
SODIUM: 137 mmol/L (ref 135–145)
TOTAL PROTEIN: 7.8 g/dL (ref 6.5–8.1)

## 2016-11-29 LAB — CBC
HCT: 36 % (ref 36.0–46.0)
Hemoglobin: 11.6 g/dL — ABNORMAL LOW (ref 12.0–15.0)
MCH: 22.4 pg — AB (ref 26.0–34.0)
MCHC: 32.2 g/dL (ref 30.0–36.0)
MCV: 69.4 fL — ABNORMAL LOW (ref 78.0–100.0)
PLATELETS: 216 10*3/uL (ref 150–400)
RBC: 5.19 MIL/uL — ABNORMAL HIGH (ref 3.87–5.11)
RDW: 14.2 % (ref 11.5–15.5)
WBC: 12.8 10*3/uL — ABNORMAL HIGH (ref 4.0–10.5)

## 2016-11-29 LAB — LIPASE, BLOOD: LIPASE: 32 U/L (ref 11–51)

## 2016-11-29 MED ORDER — FENTANYL CITRATE (PF) 100 MCG/2ML IJ SOLN
50.0000 ug | INTRAMUSCULAR | Status: DC | PRN
  Administered 2016-11-29: 50 ug via NASAL
  Filled 2016-11-29: qty 2

## 2016-11-29 NOTE — ED Notes (Signed)
Pain medication given in Triage. Patient advised about side effects of medications and  to avoid driving for a minimum of 4 hours.  

## 2016-11-29 NOTE — ED Triage Notes (Addendum)
Pt reports lower abd pain with nausea and epigastric pain x 2 days.  Denies any vomiting or diarrhea.  Pt reports the severity of the pain comes in waves.  Pt reports urinary frequency but denies any dysuria or hematuria.  Pt reports pain is better when she is leaning forward or in a fetal position.  Pt denies any vaginal d/c

## 2016-11-30 ENCOUNTER — Emergency Department (HOSPITAL_COMMUNITY)
Admission: EM | Admit: 2016-11-30 | Discharge: 2016-11-30 | Disposition: A | Discharge status: Home / Self Care | Payer: Self-pay | Attending: Emergency Medicine | Admitting: Emergency Medicine

## 2016-11-30 ENCOUNTER — Emergency Department (HOSPITAL_COMMUNITY): Payer: Self-pay

## 2016-11-30 DIAGNOSIS — K769 Liver disease, unspecified: Secondary | ICD-10-CM

## 2016-11-30 DIAGNOSIS — N83209 Unspecified ovarian cyst, unspecified side: Secondary | ICD-10-CM

## 2016-11-30 DIAGNOSIS — D259 Leiomyoma of uterus, unspecified: Secondary | ICD-10-CM

## 2016-11-30 DIAGNOSIS — R350 Frequency of micturition: Secondary | ICD-10-CM

## 2016-11-30 HISTORY — DX: Scoliosis, unspecified: M41.9

## 2016-11-30 LAB — PREGNANCY, URINE: PREG TEST UR: NEGATIVE

## 2016-11-30 MED ORDER — ONDANSETRON 4 MG PO TBDP: 4.0000 mg | ORAL_TABLET | Freq: Three times a day (TID) | ORAL | 0 refills | Status: DC | PRN

## 2016-11-30 MED ORDER — IOPAMIDOL (ISOVUE-300) INJECTION 61%
INTRAVENOUS | Status: AC
  Administered 2016-11-30: 75 mL via INTRAVENOUS
  Filled 2016-11-30: qty 75

## 2016-11-30 MED ORDER — CEPHALEXIN 500 MG PO CAPS: 500.0000 mg | ORAL_CAPSULE | Freq: Two times a day (BID) | ORAL | 0 refills | Status: DC

## 2016-11-30 MED ORDER — IOPAMIDOL (ISOVUE-300) INJECTION 61%
INTRAVENOUS | Status: AC
  Administered 2016-11-30: 30 mL via ORAL
  Filled 2016-11-30: qty 30

## 2016-11-30 NOTE — Discharge Instructions (Signed)
You have lesions on your liver, which need to be evaluated by your doctor and with a non-emergent MRI.  You have an ovarian cyst and uterine fibroid, for which you need to follow-up with your OBGYN.  Please take antibiotics as directed.

## 2016-11-30 NOTE — ED Provider Notes (Signed)
[facility] DEPT Provider Note   CSN: [id number] Arrival date & time: 11/29/16  2006     History   Chief Complaint Chief Complaint  Patient presents with  . Abdominal Pain    HPI Stefanie Ortiz is a 31 y.o. female.  Patient presents to the emergency department with chief complaint of right lower abdominal pain. She states that the pain originally started around her umbilicus, and has now isolated in the right lower quadrant. Additionally, she reports having some low back pain. She denies any fevers or chills, but does fill nauseated. She denies any dysuria or hematuria, but does state that she has had urinary frequency. She denies any vaginal discharge or bleeding. She has not taken anything for symptoms.   The history is provided by the patient. No language interpreter was used.    Past Medical History:  Diagnosis Date  . Scoliosis     Patient Active Problem List   Diagnosis Date Noted  . Arm mass 11/23/2012  . CERVICALGIA 09/05/2009  . MUSCLE SPASM, TRAPEZIUS 09/05/2009  . ALLERGIC RHINITIS DUE TO POLLEN 06/27/2009  . SCOLIOSIS , IDIOPATHIC 06/27/2009    History reviewed. No pertinent surgical history.  OB History    No data available       Home Medications    Prior to Admission medications   Medication Sig Start Date End Date Taking? Authorizing Provider  neomycin-polymyxin-hydrocortisone (CORTISPORIN) otic solution Place 4 drops into both ears 4 (four) times daily. Patient not taking: Reported on 11/30/2016 11/15/15   Ezekiel Slocumb, PA-C  traMADol (ULTRAM) 50 MG tablet Take 1 tablet (50 mg total) by mouth every 8 (eight) hours as needed. Patient not taking: Reported on 11/30/2016 11/03/15   Jaynee Eagles, PA-C    Family History No family history on file.  Social History Social History  Substance Use Topics  . Smoking status: Former Research scientist (life sciences)  . Smokeless tobacco: Never Used  . Alcohol use Yes     Comment: rare     Allergies   Patient has no known  allergies.   Review of Systems Review of Systems  All other systems reviewed and are negative.    Physical Exam Updated Vital Signs BP 122/65 (BP Location: Left Arm)   Pulse 63   Temp 98.3 F (36.8 C) (Oral)   Resp 18   LMP 11/01/2016   SpO2 100%   Physical Exam  Constitutional: She is oriented to person, place, and time. She appears well-developed and well-nourished.  HENT:  Head: Normocephalic and atraumatic.  Eyes: Pupils are equal, round, and reactive to light. Conjunctivae and EOM are normal.  Neck: Normal range of motion. Neck supple.  Cardiovascular: Normal rate and regular rhythm.  Exam reveals no gallop and no friction rub.   No murmur heard. Pulmonary/Chest: Effort normal and breath sounds normal. No respiratory distress. She has no wheezes. She has no rales. She exhibits no tenderness.  Abdominal: Soft. Bowel sounds are normal. She exhibits no distension and no mass. There is tenderness. There is no rebound and no guarding.  RLQ TTP  Musculoskeletal: Normal range of motion. She exhibits no edema or tenderness.  Neurological: She is alert and oriented to person, place, and time.  Skin: Skin is warm and dry.  Psychiatric: She has a normal mood and affect. Her behavior is normal. Judgment and thought content normal.  Nursing note and vitals reviewed.    ED Treatments / Results  Labs (all labs ordered are listed, but only abnormal results are  displayed) Labs Reviewed  CBC - Abnormal; Notable for the following:       Result Value   WBC 12.8 (*)    RBC 5.19 (*)    Hemoglobin 11.6 (*)    MCV 69.4 (*)    MCH 22.4 (*)    All other components within normal limits  URINALYSIS, ROUTINE W REFLEX MICROSCOPIC - Abnormal; Notable for the following:    Bacteria, UA RARE (*)    Squamous Epithelial / LPF 0-5 (*)    All other components within normal limits  LIPASE, BLOOD  COMPREHENSIVE METABOLIC PANEL  PREGNANCY, URINE    EKG  EKG Interpretation None        Radiology Ct Abdomen Pelvis W Contrast  Result Date: 11/30/2016 CLINICAL DATA:  Lower abdominal pain.  Nausea.  Epigastric pain. EXAM: CT ABDOMEN AND PELVIS WITH CONTRAST TECHNIQUE: Multidetector CT imaging of the abdomen and pelvis was performed using the standard protocol following bolus administration of intravenous contrast. CONTRAST:  75 cc Isovue 300 IV COMPARISON:  None. FINDINGS: Lower chest: The lung bases are clear. Hepatobiliary: There is a 4.3 cm lobular slightly hyperattenuating lesion in the right hepatic lobe segment 6/7. There is some peripheral enhancement or displacement of vessels. A second 1.6 cm lesion with similar imaging characteristics is seen in segment 5/6. Question of additional tiny lesions in the left lobe. Gallbladder partial distended, no calcified stone. No biliary dilatation. Pancreas: No ductal dilatation or inflammation. Spleen: Normal in size without focal abnormality. Adrenals/Urinary Tract: No adrenal nodule. 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 There is no hydronephrosis. No perinephric edema. Urinary bladder is only minimally distended, no wall thickening for degree of distension. Stomach/Bowel: Stomach is within normal limits. Appendix is air-filled and normal. No evidence of bowel wall thickening, distention, or inflammatory changes. Vascular/Lymphatic: No significant vascular findings are present. No enlarged abdominal or pelvic lymph nodes. Reproductive: Probable myometrial fibroid in the anterior uterus. Peripherally enhancing 19 mm cyst in the left ovary consistent with corpus luteum. Follicular cyst in the right ovary measures 17 mm. Other: Trace pelvic free fluid. No upper abdominal ascites. No free air. No intra-abdominal abscess. Musculoskeletal: Moderate levo scoliotic curvature of spine. There are no acute or suspicious osseous abnormalities. IMPRESSION: 1. Two right lobe hepatic lesions, larger measuring 4.3 cm, with probable 2  additional small lesions in the left lobe. CT findings are nonspecific and may reflect adenomas or atypical hemangiomas. Recommend characterization with MRI. 2. Congenital pelvic right kidney. 3. Probable uterine fibroid. Corpus luteal cyst in the left ovary. Small follicular cyst in the right ovary. GYN structures would be better assessed with ultrasound based on clinical concern. Electronically Signed   By: Jeb Levering M.D.   On: 11/30/2016 06:08    Procedures Procedures (including critical care time)  Medications Ordered in ED Medications  fentaNYL (SUBLIMAZE) injection 50 mcg (50 mcg Nasal Given 11/29/16 2209)     Initial Impression / Assessment and Plan / ED Course  I have reviewed the triage vital signs and the nursing notes.  Pertinent labs & imaging results that were available during my care of the patient were reviewed by me and considered in my medical decision making (see chart for details).    Patient with urinary frequency, and right lower quadrant pain. She does have some tenderness in the right lower quadrant, will check CT given that her symptoms are usually started in the periumbilical region, and she does have mildly elevated white count.  CT is  negative for appendicitis. She does have some hepatic lesions, which will need outpatient MRI and follow-up. I have discussed this with the patient, who understands agrees the plan. Also recommend close follow-up with GYN she has uterine fibroid and ovarian cyst. Her vital signs are stable. She is not in acute distress. Discharged home with Keflex for urinary frequency.  Final Clinical Impressions(s) / ED Diagnoses   Final diagnoses:  Frequency of urination  Hepatic lesion  Uterine leiomyoma, unspecified location  Cyst of ovary, unspecified laterality    New Prescriptions New Prescriptions   CEPHALEXIN (KEFLEX) 500 MG CAPSULE    Take 1 capsule (500 mg total) by mouth 2 (two) times daily.   ONDANSETRON (ZOFRAN ODT) 4 MG  DISINTEGRATING TABLET    Take 1 tablet (4 mg total) by mouth every 8 (eight) hours as needed for nausea or vomiting.     Montine Circle, PA-C 11/30/16 1505    Randal Buba, April, MD 11/30/16 [phone]

## 2017-07-23 ENCOUNTER — Other Ambulatory Visit: Payer: Self-pay | Admitting: Family Medicine

## 2017-07-23 ENCOUNTER — Other Ambulatory Visit (HOSPITAL_COMMUNITY)
Admission: RE | Admit: 2017-07-23 | Discharge: 2017-07-23 | Disposition: A | Discharge status: Home / Self Care | Payer: BLUE CROSS/BLUE SHIELD | Source: Ambulatory Visit | Attending: Family Medicine | Admitting: Family Medicine

## 2017-07-23 DIAGNOSIS — Z124 Encounter for screening for malignant neoplasm of cervix: Secondary | ICD-10-CM | POA: Insufficient documentation

## 2017-07-23 DIAGNOSIS — Z202 Contact with and (suspected) exposure to infections with a predominantly sexual mode of transmission: Secondary | ICD-10-CM | POA: Diagnosis not present

## 2017-07-23 DIAGNOSIS — R8290 Unspecified abnormal findings in urine: Secondary | ICD-10-CM | POA: Diagnosis not present

## 2017-07-24 LAB — CYTOLOGY - PAP
CHLAMYDIA, DNA PROBE: NEGATIVE
DIAGNOSIS: NEGATIVE
HPV: NOT DETECTED
NEISSERIA GONORRHEA: NEGATIVE
Trichomonas: NEGATIVE

## 2017-08-06 ENCOUNTER — Encounter: Payer: Self-pay | Admitting: Registered"

## 2017-08-06 ENCOUNTER — Encounter: Payer: BLUE CROSS/BLUE SHIELD | Attending: Family Medicine | Admitting: Registered"

## 2017-08-06 DIAGNOSIS — Z681 Body mass index (BMI) 19 or less, adult: Secondary | ICD-10-CM | POA: Diagnosis not present

## 2017-08-06 DIAGNOSIS — R636 Underweight: Secondary | ICD-10-CM | POA: Insufficient documentation

## 2017-08-06 DIAGNOSIS — Z713 Dietary counseling and surveillance: Secondary | ICD-10-CM | POA: Diagnosis not present

## 2017-08-06 NOTE — Progress Notes (Signed)
Medical Nutrition Therapy:  Appt start time: 10:00 end time:  10:50  Pt expectations: more meal ideas, time management to help increase weight,   Assessment:  Primary concerns today: Pt states her weight was 117 lbs (2017) and has declined since then, currently 91 lbs. Pt states she is concerned for significant weight loss. Pt states her appetite hasn't been the same. Pt states she makes sure she eats. Pt states she has recently started exercising with sister. Pt reports her job being very stressful. 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 Pt states she received a promotion 2 months ago, increased flexibility and increased stress. Pt states she started working for this company in 2017. Pt states she is currently looking for another job to help reduce stress. Pt states she works 40-50 hours a week, 1-10pm. Pt reports sleeping 1-8am, + 1 hr nap; averages 7 hrs/night. Pt states she is off work every [employer] and Sat.   Pt states she does not have much of an appetite Pt states tomato-based items cause reflux such pizza, pasta, greasy foods. Pt states she eats chips sometimes, prefers them to be plain. Pt states she has always been small and her family is small; her mom is the same size as her. Pt states she has her spirituality to keep her balanced. 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 Pt states her hobbies include - watching youtube videos, drawing, painting, sketching, being creative, helping sister with business.   Preferred Learning Style:   No preference indicated   Learning Readiness:   Ready  Change in progress   MEDICATIONS: See list   DIETARY INTAKE:  Usual eating pattern includes 2-3 meals and 1-2 snacks per day.  Everyday foods include hot dogs, pasta, fruit, granola bars.  Avoided foods include mushrooms, tomato-based  items.    24-hr recall:  B ( AM): eggs, bacon, toast, grits or banana, granola bar  Snk ( AM): granola bars or protein bars or yogurt or trail mix  L ( PM): pasta or hot dogs, fries Snk ( PM): granola bars or protein bars or yogurt or trail mix  D ( PM): pasta, meatballs Snk ( PM): none Beverages: water, juice, sweet tea, ginger beer (occasionally), whole milk  Usual physical activity: stretching, strength training, cardio 2-3 days/week  Estimated energy needs: 2000 calories 225 g carbohydrates 150 g protein 56 g fat  Progress Towards Goal(s):  In progress.   Nutritional Diagnosis:  NB-1.1 Food and nutrition-related knowledge deficit As related to lack of prior nutrition-related education.  As evidenced by no prior knowledge provided on how to apply food related information.    Intervention:  Nutrition education and counseling.Pt was educated and counseled on the importance of meal planning on her "off days", preparing meals ahead of time, and creating a schedule, and taking breaks at work to recharge.  Goals: - Meal plan for upcoming week on Saturday and Power during one of your 15 minute breaks.  - Eat breakfast before leaving home to go to work.  - Practice mindful eating.  - Use handout to help with ideas for high calorie food/drink items.   - Eat at least five small meals and snacks each  day.   - Drink healthy beverages that add calories. For  example, have juice, milk, or  shakes.   -  Drink nutritional supplements.   - Try high-calorie, high-protein recipes. Sweeten  foods and beverages with sugar, jam, jelly, or  honey.  - Choose higher-calorie starchy vegetables such  as potatoes, corn, and peas. Add cream, butter,  margarine, cheese sauce, olive oil, or salad  dressing to get more calories.   - Eat fruit canned in heavy syrup.   - Choose foods high in protein. These include  milk, eggs, cheese, meat, fish, poultry, and  beans.   - You may also use  protein powders and meal  replacement shakes and bars.   - Add high-fat foods to meals and snacks:    - Choices include butter, regular   margarine, vegetable oils, peanut butter,   and mayonnaise.    - Whole milk, half-and-half, and cream   have more calories than skim or   low-fat milk.    - Higher-fat meats and whole-milk   cheeses provide more calories than lean   or low-fat types.  Teaching Method Utilized:  Visual Auditory Hands on  Handouts given during visit include:  Underweight Nutrition Therapy  Barriers to learning/adherence to lifestyle change: work-life balance and stress  Demonstrated degree of understanding via:  Teach Back   Monitoring/Evaluation:  Dietary intake, exercise, and body weight prn.

## 2017-08-06 NOTE — Patient Instructions (Addendum)
-   Meal plan for upcoming week on Saturday and [place] during one of your 15 minute breaks.   - Eat breakfast before leaving home to go to work.   - Practice mindful eating.   - Use handout to help with ideas for high calorie food/drink items.   - Eat at least five small meals and snacks each day.   - Drink healthy beverages that add calories. For example, have juice, milk, or   shakes.   - Drink nutritional supplements.   - Try high-calorie, high-protein recipes. Sweeten foods and beverages with sugar,   jam, jelly, or honey.  - Choose higher-calorie starchy vegetables such as potatoes, corn, and peas. Add   cream, butter, margarine, cheese sauce, olive oil, or salad dressing to get   more calories.   - Eat fruit canned in heavy syrup.   - Choose foods high in protein. These include milk, eggs, cheese, meat, fish,   poultry, and beans.   - You may also use protein powders and meal replacement shakes and bars.   - Add high-fat foods to meals and snacks:    - Choices include butter, regular margarine, vegetable oils, peanut butter,   and mayonnaise.    - Whole milk, half-and-half, and cream have more calories than skim or   low-fat milk.    - Higher-fat meats and whole-milk cheeses provide more calories than lean   or low-fat types.

## 2017-08-25 DIAGNOSIS — D1720 Benign lipomatous neoplasm of skin and subcutaneous tissue of unspecified limb: Secondary | ICD-10-CM | POA: Diagnosis not present

## 2017-08-25 DIAGNOSIS — D171 Benign lipomatous neoplasm of skin and subcutaneous tissue of trunk: Secondary | ICD-10-CM | POA: Diagnosis not present

## 2017-09-17 ENCOUNTER — Encounter: Payer: Self-pay | Admitting: Registered"

## 2017-09-17 ENCOUNTER — Encounter: Payer: BLUE CROSS/BLUE SHIELD | Attending: Family Medicine | Admitting: Registered"

## 2017-09-17 DIAGNOSIS — Z681 Body mass index (BMI) 19 or less, adult: Secondary | ICD-10-CM | POA: Insufficient documentation

## 2017-09-17 DIAGNOSIS — R636 Underweight: Secondary | ICD-10-CM | POA: Insufficient documentation

## 2017-09-17 DIAGNOSIS — Z713 Dietary counseling and surveillance: Secondary | ICD-10-CM | POA: Insufficient documentation

## 2017-09-17 NOTE — Progress Notes (Signed)
Medical Nutrition Therapy:  Appt start time: 9:45 end time: 10:25  Pt expectations: more meal ideas, time management to help increase weight   Assessment:  Primary concerns today: Pt states her weight was 117 lbs (2017) and has declined since then, currently 91 lbs. Pt states she is concerned for significant weight loss. Pt states her appetite hasn't been the same. Pt states she makes sure she eats. Pt states she has recently started exercising with sister. Pt reports her job being very stressful. 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 Pt states she received a promotion 2 months ago, increased flexibility and increased stress. Pt states she started working for this company in 2017. Pt states she is currently looking for another job to help reduce stress. Pt states she works 40-50 hours a week, 1-10pm. Pt reports sleeping 1-8am, + 1 hr nap; averages 7 hrs/night. Pt states she is off work every [employer] and Sat.   Pt states she weighs herself 2x/week. Pt states she didn't meal plan the first 2 weeks and lost 2 more lbs. Pt states she wakes up at 8am. Pt states she has been more intentional about having a well-balanced breakfast but unable to eat the complete meal. Pt states she has been taking a breath of fresh air during 15 min breaks at work.  Pt states she is in the process of moving to another apartment in the next 2 months. Pt states she is doing a better job of tolerating stress at her job. Pt states she has thought about seeking mental health professional due to having a challenging time in 2017.   Pt states she now weighs 94 lbs; gained 3 lbs since last visit. Pt states she gets full quickly and feels like she is forcing herself to eat sometimes. Pt states she feels it at breakfast sometimes but mostly at lunch and before going to sleep. Pt states she only eats a few bites; not at every meal but every so often. Pt states  yesterday was pretty normal day. Pt states it is difficult for her to chew food, makes up calories in protein shakes.   Pt states she does not have much of an appetite Pt states tomato-based items cause reflux such pizza, pasta, greasy foods. Pt states she eats chips sometimes, prefers them to be plain. Pt states she has always been small and her family is small; her mom is the same size as her. Pt states she has her spirituality to keep her balanced. 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 Pt states her hobbies include - watching youtube videos, drawing, painting, sketching, being creative, helping sister with business.   Preferred Learning Style:   No preference indicated   Learning Readiness:   Ready  Change in progress   MEDICATIONS: See list   DIETARY INTAKE:  Usual eating pattern includes 2-3 meals and 1-2 snacks per day.  Everyday foods include hot dogs, pasta, fruit, granola bars.  Avoided foods include mushrooms, tomato-based items.    24-hr recall:  B (8 AM): eggs, bacon, toast, grits, banana or bagel with cream cheese  Snk (12 PM): yogurt or granola bars or protein bars or yogurt or trail mix  S: pretzels, goldfish L (4-5:30 PM): [employer]'s- 2-3 bites of McChicken sandwich, fries, sweet tea or pasta with meatball or salad + grilled chicken +  macaroni/cheese or potatoes or hot dog + fries Snk ( PM): , apple pie or granola bars or protein bars or yogurt or trail mix or nuts D ( PM): salmon + shrimp + rice + spinach or pasta, meatballs Snk ( PM): 1/3 protein shake Beverages: water, juice, sweet tea, ginger beer (occasionally), whole milk  Usual physical activity: stretching 2-3x/week, strength training, jogging 2 days/week  Estimated energy needs: 2000 calories 225 g carbohydrates 150 g protein 56 g fat  Progress Towards Goal(s):  In progress.   Nutritional Diagnosis:  NB-1.1 Food  and nutrition-related knowledge deficit As related to lack of prior nutrition-related education.  As evidenced by no prior knowledge provided on how to apply food related information.    Intervention:  Nutrition education and counseling. Pt was educated and counseled on nutritional shake supplements to add to her day while working and the importance of contacting a Education officer, community.  Goals: - Try Boost as snack option while working.  Therapist, art.  - Keep up the great work!  Teaching Method Utilized:  Visual Auditory Hands on  Handouts given during visit include:  Underweight Nutrition Therapy  Barriers to learning/adherence to lifestyle change: work-life balance and stress  Demonstrated degree of understanding via:  Teach Back   Monitoring/Evaluation:  Dietary intake, exercise, and body weight in 1 month(s).

## 2017-09-17 NOTE — Patient Instructions (Addendum)
-   Try Boost as snack option while working.   Therapist, art.   - Keep up the great work!

## 2017-10-22 DIAGNOSIS — R636 Underweight: Secondary | ICD-10-CM | POA: Diagnosis not present

## 2017-11-12 ENCOUNTER — Ambulatory Visit: Payer: BLUE CROSS/BLUE SHIELD | Admitting: Registered"

## 2018-04-23 DIAGNOSIS — S139XXA Sprain of joints and ligaments of unspecified parts of neck, initial encounter: Secondary | ICD-10-CM | POA: Diagnosis not present

## 2018-09-22 DIAGNOSIS — F331 Major depressive disorder, recurrent, moderate: Secondary | ICD-10-CM | POA: Diagnosis not present

## 2018-09-22 DIAGNOSIS — F411 Generalized anxiety disorder: Secondary | ICD-10-CM | POA: Diagnosis not present

## 2018-10-14 IMAGING — CT CT ABD-PELV W/ CM
2 of 4 series · 16 of 46 positions shown, 18 images · IV contrast (ISOVUE)
Comparison: None.

CLINICAL DATA: Lower abdominal pain.  Nausea.  Epigastric pain.

EXAM:
CT ABDOMEN AND PELVIS WITH CONTRAST
TECHNIQUE: Multidetector CT imaging of the abdomen and pelvis was performed
using the standard protocol following bolus administration of
intravenous contrast.
CONTRAST:  75 cc Isovue 300 IV

[Series 2: abd/pel with · axial · 0.74mm/px · z∈[+883,+1208]mm · 13 of 73 slices shown, 15 images]
[im 4/73  soft-tissue]
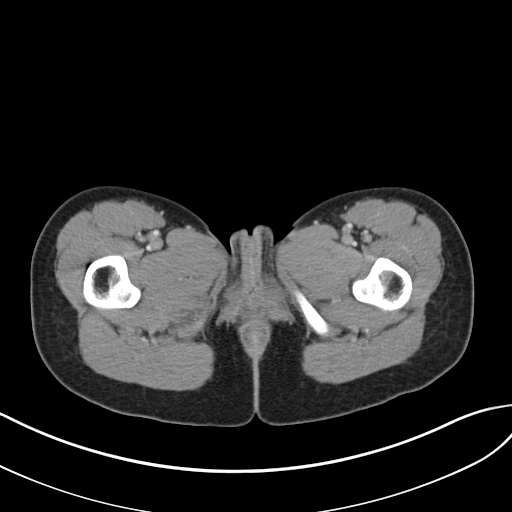
[im 4/73  bone]
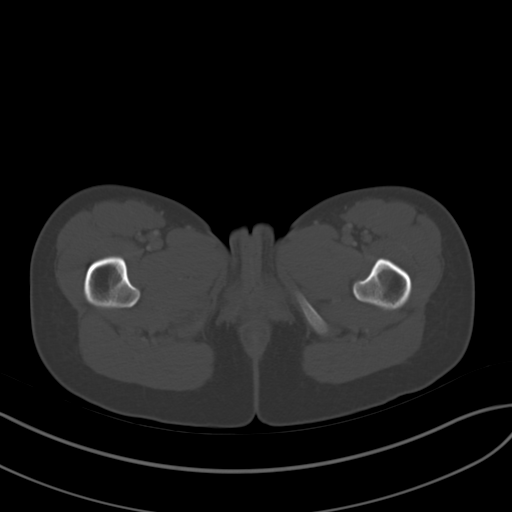
[im 11/73  soft-tissue]
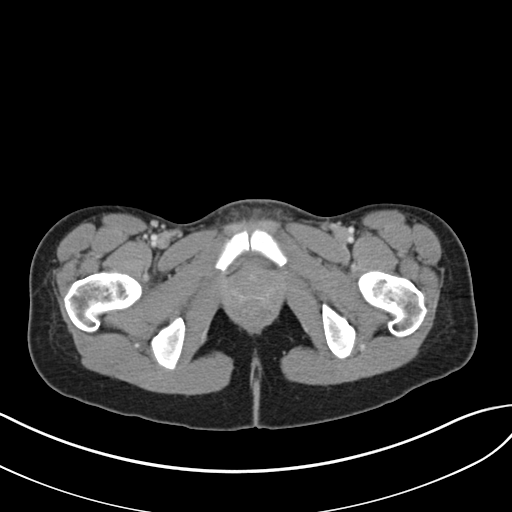
[im 15/73  soft-tissue]
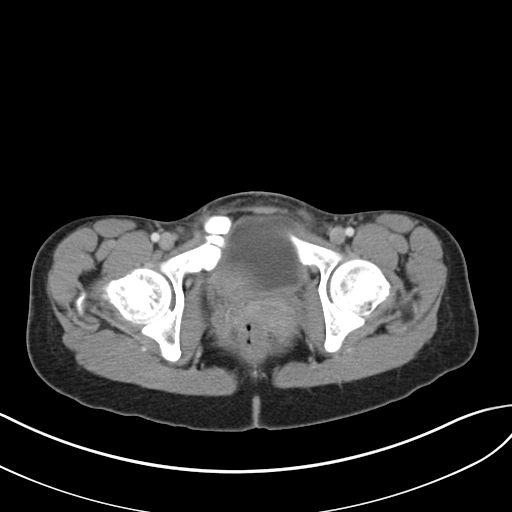
[im 22/73  soft-tissue]
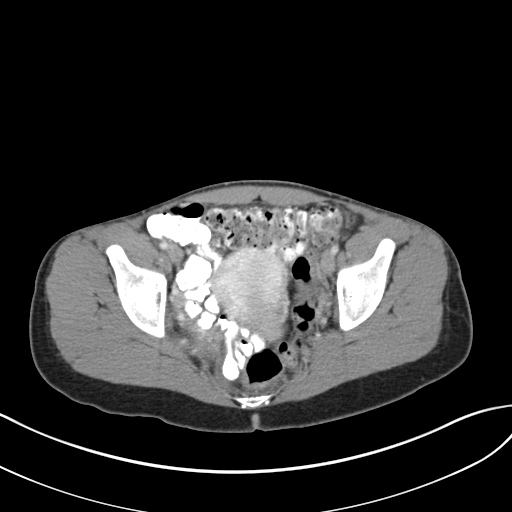
[im 26/73  soft-tissue]
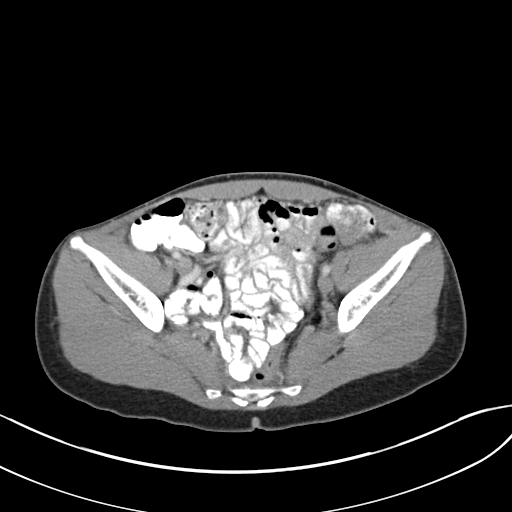
[im 33/73  soft-tissue]
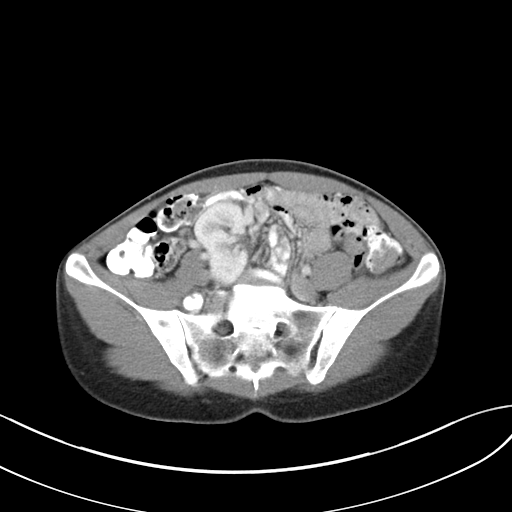
[im 37/73  soft-tissue]
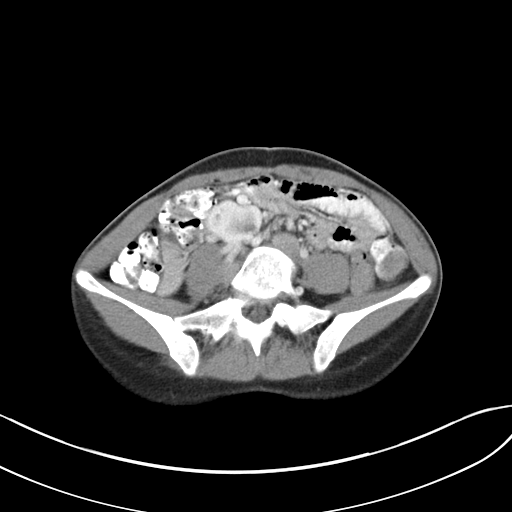
[im 40/73  soft-tissue]
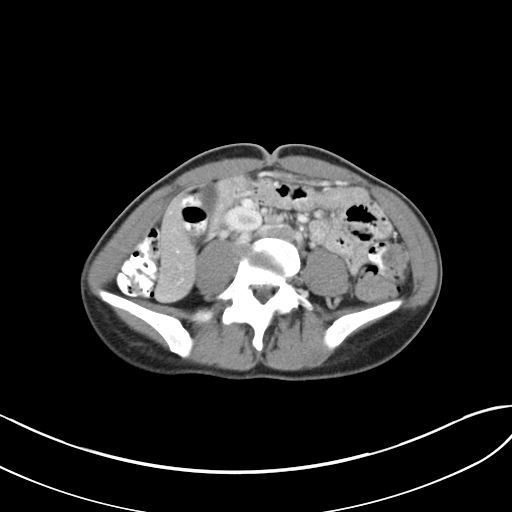
[im 47/73  soft-tissue]
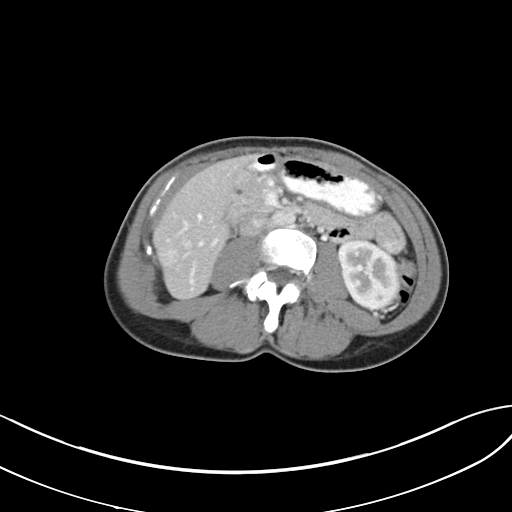
[im 47/73  bone]
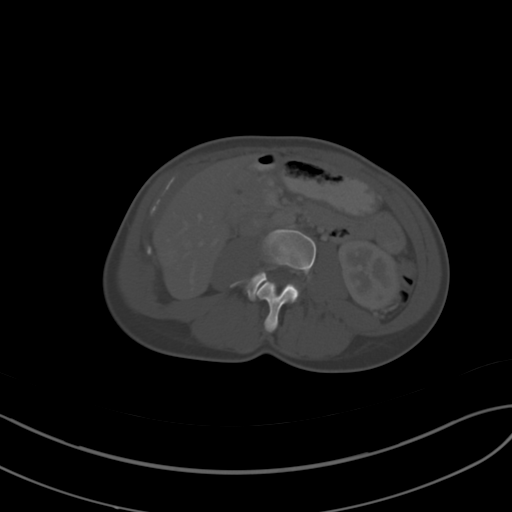
[im 51/73  soft-tissue]
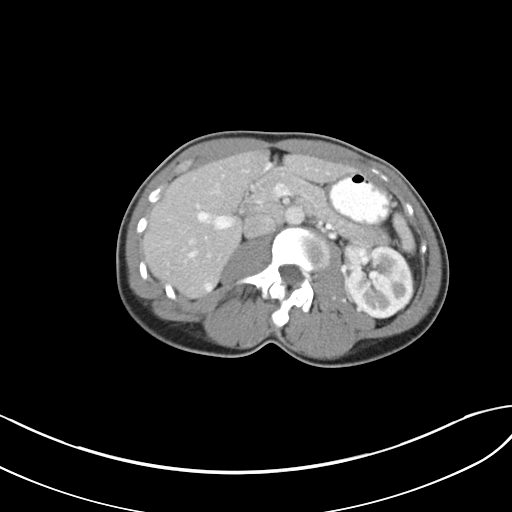
[im 58/73  soft-tissue]
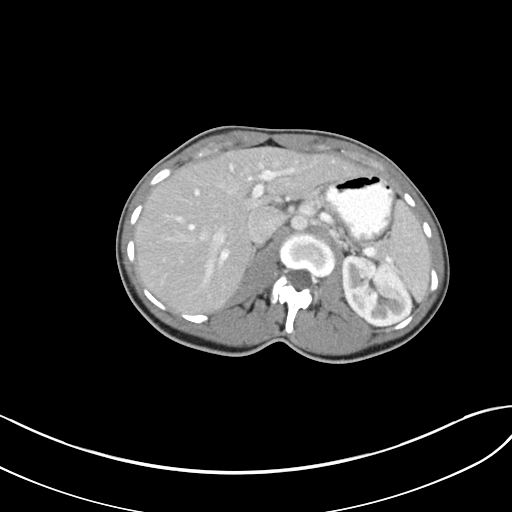
[im 62/73  soft-tissue]
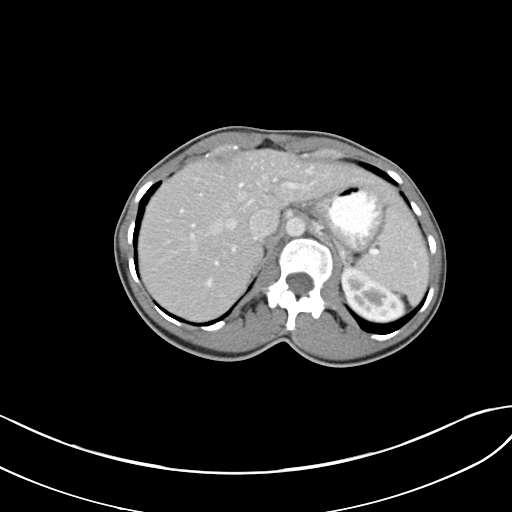
[im 69/73  soft-tissue]
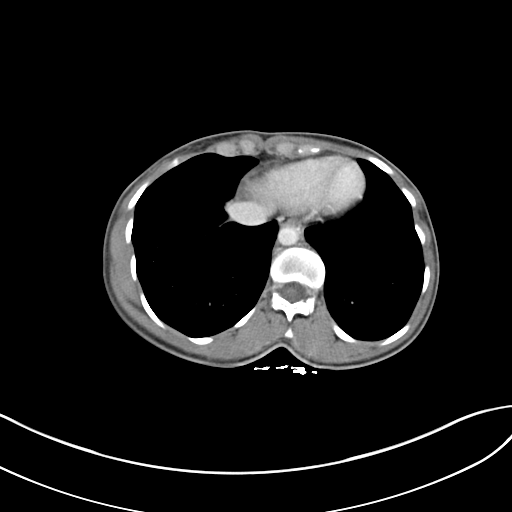

[Series 3: coronal a/|p · coronal · 0.67mm/px · 3 of 102 slices shown]
[im 34/102  soft-tissue]
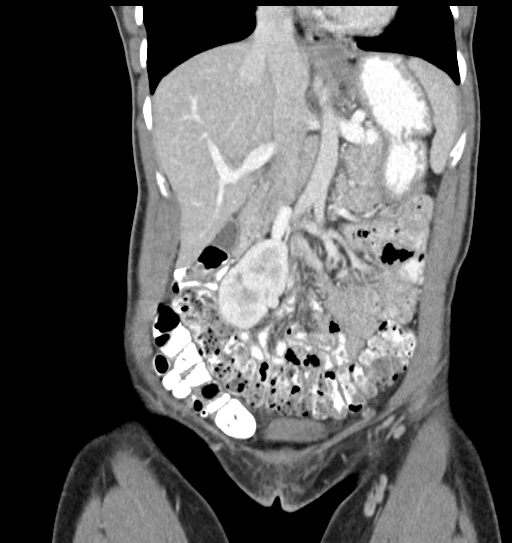
[im 45/102  soft-tissue]
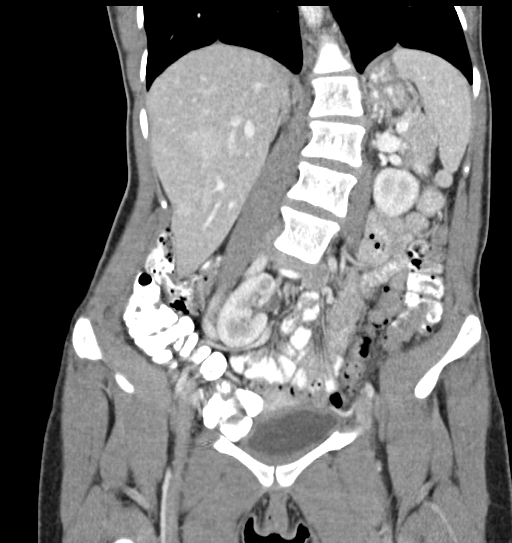
[im 57/102  soft-tissue]
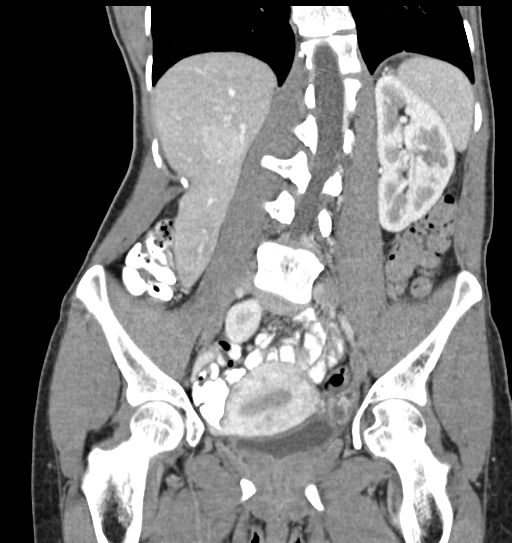

[16 of 46 positions shown; findings below may reference images not displayed]

FINDINGS: Lower chest: The lung bases are clear.

Hepatobiliary: There is a 4.3 cm lobular slightly hyperattenuating
lesion in the right hepatic lobe segment [DATE]. There is some
peripheral enhancement or displacement of vessels. A second 1.6 cm
lesion with similar imaging characteristics is seen in segment [DATE].
Question of additional tiny lesions in the left lobe. Gallbladder
partial distended, no calcified stone. No biliary dilatation.

Pancreas: No ductal dilatation or inflammation.

Spleen: Normal in size without focal abnormality.

Adrenals/Urinary Tract: No adrenal nodule. Congenitally ectopic
right kidney located in the pelvis. There is no hydronephrosis. No
perinephric edema. Urinary bladder is only minimally distended, no
wall thickening for degree of distension.

Stomach/Bowel: Stomach is within normal limits. Appendix is
air-filled and normal. No evidence of bowel wall thickening,
distention, or inflammatory changes.

Vascular/Lymphatic: No significant vascular findings are present. No
enlarged abdominal or pelvic lymph nodes.

Reproductive: Probable myometrial fibroid in the anterior uterus.
Peripherally enhancing 19 mm cyst in the left ovary consistent with
corpus luteum. Follicular cyst in the right ovary measures 17 mm.

Other: Trace pelvic free fluid. No upper abdominal ascites. No free
air. No intra-abdominal abscess.

Musculoskeletal: Moderate levo scoliotic curvature of spine. There
are no acute or suspicious osseous abnormalities.
IMPRESSION: 1. Two right lobe hepatic lesions, larger measuring 4.3 cm, with
probable 2 additional small lesions in the left lobe. CT findings
are nonspecific and may reflect adenomas or atypical hemangiomas.
Recommend characterization with MRI.
2. Congenital pelvic right kidney.
3. Probable uterine fibroid. Corpus luteal cyst in the left ovary.
Small follicular cyst in the right ovary. GYN structures would be
better assessed with ultrasound based on clinical concern.

## 2018-10-21 DIAGNOSIS — F411 Generalized anxiety disorder: Secondary | ICD-10-CM | POA: Diagnosis not present

## 2018-10-21 DIAGNOSIS — F4323 Adjustment disorder with mixed anxiety and depressed mood: Secondary | ICD-10-CM | POA: Diagnosis not present

## 2018-10-21 DIAGNOSIS — F331 Major depressive disorder, recurrent, moderate: Secondary | ICD-10-CM | POA: Diagnosis not present

## 2018-10-28 DIAGNOSIS — F4323 Adjustment disorder with mixed anxiety and depressed mood: Secondary | ICD-10-CM | POA: Diagnosis not present

## 2018-11-04 DIAGNOSIS — F4323 Adjustment disorder with mixed anxiety and depressed mood: Secondary | ICD-10-CM | POA: Diagnosis not present

## 2018-11-18 DIAGNOSIS — Z0000 Encounter for general adult medical examination without abnormal findings: Secondary | ICD-10-CM | POA: Diagnosis not present

## 2018-11-18 DIAGNOSIS — F4323 Adjustment disorder with mixed anxiety and depressed mood: Secondary | ICD-10-CM | POA: Diagnosis not present

## 2018-11-23 DIAGNOSIS — Z13220 Encounter for screening for lipoid disorders: Secondary | ICD-10-CM | POA: Diagnosis not present

## 2018-11-23 DIAGNOSIS — Z681 Body mass index (BMI) 19 or less, adult: Secondary | ICD-10-CM | POA: Diagnosis not present

## 2018-11-25 DIAGNOSIS — F4323 Adjustment disorder with mixed anxiety and depressed mood: Secondary | ICD-10-CM | POA: Diagnosis not present

## 2018-12-09 DIAGNOSIS — F4323 Adjustment disorder with mixed anxiety and depressed mood: Secondary | ICD-10-CM | POA: Diagnosis not present

## 2018-12-11 DIAGNOSIS — D171 Benign lipomatous neoplasm of skin and subcutaneous tissue of trunk: Secondary | ICD-10-CM | POA: Diagnosis not present

## 2018-12-11 DIAGNOSIS — D1720 Benign lipomatous neoplasm of skin and subcutaneous tissue of unspecified limb: Secondary | ICD-10-CM | POA: Diagnosis not present

## 2018-12-16 DIAGNOSIS — F4323 Adjustment disorder with mixed anxiety and depressed mood: Secondary | ICD-10-CM | POA: Diagnosis not present

## 2018-12-23 DIAGNOSIS — F331 Major depressive disorder, recurrent, moderate: Secondary | ICD-10-CM | POA: Diagnosis not present

## 2018-12-23 DIAGNOSIS — F4323 Adjustment disorder with mixed anxiety and depressed mood: Secondary | ICD-10-CM | POA: Diagnosis not present

## 2018-12-23 DIAGNOSIS — F411 Generalized anxiety disorder: Secondary | ICD-10-CM | POA: Diagnosis not present

## 2018-12-30 DIAGNOSIS — F4323 Adjustment disorder with mixed anxiety and depressed mood: Secondary | ICD-10-CM | POA: Diagnosis not present

## 2019-01-06 DIAGNOSIS — F4323 Adjustment disorder with mixed anxiety and depressed mood: Secondary | ICD-10-CM | POA: Diagnosis not present

## 2019-01-08 DIAGNOSIS — Z1159 Encounter for screening for other viral diseases: Secondary | ICD-10-CM | POA: Diagnosis not present

## 2019-01-14 ENCOUNTER — Other Ambulatory Visit: Payer: Self-pay | Admitting: Surgery

## 2019-01-14 DIAGNOSIS — D171 Benign lipomatous neoplasm of skin and subcutaneous tissue of trunk: Secondary | ICD-10-CM | POA: Diagnosis not present

## 2019-01-14 DIAGNOSIS — D1721 Benign lipomatous neoplasm of skin and subcutaneous tissue of right arm: Secondary | ICD-10-CM | POA: Diagnosis not present

## 2019-01-14 DIAGNOSIS — D1739 Benign lipomatous neoplasm of skin and subcutaneous tissue of other sites: Secondary | ICD-10-CM | POA: Diagnosis not present

## 2019-01-20 DIAGNOSIS — F4323 Adjustment disorder with mixed anxiety and depressed mood: Secondary | ICD-10-CM | POA: Diagnosis not present

## 2019-01-27 DIAGNOSIS — F4323 Adjustment disorder with mixed anxiety and depressed mood: Secondary | ICD-10-CM | POA: Diagnosis not present

## 2019-02-03 DIAGNOSIS — F4323 Adjustment disorder with mixed anxiety and depressed mood: Secondary | ICD-10-CM | POA: Diagnosis not present

## 2019-05-07 DIAGNOSIS — F331 Major depressive disorder, recurrent, moderate: Secondary | ICD-10-CM | POA: Diagnosis not present

## 2019-05-26 DIAGNOSIS — F321 Major depressive disorder, single episode, moderate: Secondary | ICD-10-CM | POA: Diagnosis not present

## 2019-06-02 DIAGNOSIS — F411 Generalized anxiety disorder: Secondary | ICD-10-CM | POA: Diagnosis not present

## 2019-06-09 DIAGNOSIS — F321 Major depressive disorder, single episode, moderate: Secondary | ICD-10-CM | POA: Diagnosis not present

## 2023-12-25 ENCOUNTER — Inpatient Hospital Stay (HOSPITAL_COMMUNITY)
Admission: AD | Admit: 2023-12-25 | Discharge: 2023-12-25 | Disposition: A | Discharge status: Home / Self Care | Attending: Family Medicine | Admitting: Family Medicine

## 2023-12-25 ENCOUNTER — Encounter (HOSPITAL_COMMUNITY): Payer: Self-pay | Admitting: Obstetrics & Gynecology

## 2023-12-25 ENCOUNTER — Inpatient Hospital Stay (HOSPITAL_COMMUNITY)

## 2023-12-25 ENCOUNTER — Other Ambulatory Visit: Payer: Self-pay

## 2023-12-25 DIAGNOSIS — O26891 Other specified pregnancy related conditions, first trimester: Secondary | ICD-10-CM | POA: Insufficient documentation

## 2023-12-25 DIAGNOSIS — O209 Hemorrhage in early pregnancy, unspecified: Secondary | ICD-10-CM | POA: Diagnosis not present

## 2023-12-25 DIAGNOSIS — Z3A01 Less than 8 weeks gestation of pregnancy: Secondary | ICD-10-CM

## 2023-12-25 DIAGNOSIS — O200 Threatened abortion: Secondary | ICD-10-CM | POA: Insufficient documentation

## 2023-12-25 DIAGNOSIS — M549 Dorsalgia, unspecified: Secondary | ICD-10-CM | POA: Diagnosis not present

## 2023-12-25 DIAGNOSIS — R102 Pelvic and perineal pain: Secondary | ICD-10-CM | POA: Insufficient documentation

## 2023-12-25 LAB — URINALYSIS, ROUTINE W REFLEX MICROSCOPIC
Bilirubin Urine: NEGATIVE
Glucose, UA: NEGATIVE mg/dL
Hgb urine dipstick: NEGATIVE
Ketones, ur: NEGATIVE mg/dL
Leukocytes,Ua: NEGATIVE
Nitrite: NEGATIVE
Protein, ur: NEGATIVE mg/dL
Specific Gravity, Urine: 1.015 (ref 1.005–1.030)
pH: 7 (ref 5.0–8.0)

## 2023-12-25 LAB — CBC
HCT: 41.9 % (ref 36.0–46.0)
Hemoglobin: 13.4 g/dL (ref 12.0–15.0)
MCH: 23.3 pg — ABNORMAL LOW (ref 26.0–34.0)
MCHC: 32 g/dL (ref 30.0–36.0)
MCV: 73 fL — ABNORMAL LOW (ref 80.0–100.0)
Platelets: 264 K/uL (ref 150–400)
RBC: 5.74 MIL/uL — ABNORMAL HIGH (ref 3.87–5.11)
RDW: 13.2 % (ref 11.5–15.5)
WBC: 10.5 K/uL (ref 4.0–10.5)
nRBC: 0 % (ref 0.0–0.2)

## 2023-12-25 LAB — HCG, QUANTITATIVE, PREGNANCY: hCG, Beta Chain, Quant, S: 47029 m[IU]/mL — ABNORMAL HIGH (ref <5)

## 2023-12-25 LAB — WET PREP, GENITAL
Clue Cells Wet Prep HPF POC: NONE SEEN
Sperm: NONE SEEN
Trich, Wet Prep: NONE SEEN
WBC, Wet Prep HPF POC: <10 (ref <10)
Yeast Wet Prep HPF POC: NONE SEEN

## 2023-12-25 LAB — POCT PREGNANCY, URINE: Preg Test, Ur: POSITIVE — AB

## 2023-12-25 NOTE — Discharge Instructions (Signed)
 It was a pleasure taking care of you today.  I am sorry you are having this bleeding.  An ultrasound was performed showing an intrauterine pregnancy but the heartbeat was a little low.  I recommend a viability scan in 2 weeks.  If your bleeding worsens or your pain worsens please return for further evaluation.  I hope you have a good rest of your day!

## 2023-12-25 NOTE — [facility] Note (Signed)
 Stefanie Ortiz is a 38 y.o. at [facility] here in [facility] reporting: she's pregnant and is having back pain near tailbone and abdominal pain.  Also reports was having bright red VB on Monday, but now spotting brown discharge with wiping.  LMP: 11/18/2023 Onset of complaint: Monday Pain score: 6 abdomen & 4 back Vitals:   12/25/23 1302  BP: 97/63  Pulse: 77  Resp: 20  Temp: 98.6 F (37 C)  SpO2: 100%     FHT: NA  Lab orders placed from triage: UPT

## 2023-12-25 NOTE — MAU Provider Note (Signed)
 History     CSN: [id number]  Arrival date and time: 12/25/23 1143   None     Chief Complaint  Patient presents with   Back Pain   Abdominal Pain   Vaginal Bleeding   HPI Patient presents to the MAU for vaginal bleeding in early pregnancy.  Reports a positive pregnancy test approximately a week ago.  She has noticed bleeding over the last week which was bright red earlier in the week but turned to brownish.  Also reports sudden episodes of sharp back pain and pelvic pain.  Denies any constipation.  Reports last sexual activity was August 20.  No other symptoms.  Reports history of 2 previous miscarriages.  OB History     Gravida  1   Para      Term      Preterm      AB      Living         SAB      IAB      Ectopic      Multiple      Live Births              Past Medical History:  Diagnosis Date   Scoliosis     No past surgical history on file.  Family History  Problem Relation Age of Onset   Diabetes Other    Hypertension Other    Stroke Other     Social History   Tobacco Use   Smoking status: Former   Smokeless tobacco: Never  Substance Use Topics   Alcohol use: Yes    Comment: rare   Drug use: No    Allergies: No Known Allergies  Medications Prior to Admission  Medication Sig Dispense Refill Last Dose/Taking   cephALEXin  (KEFLEX ) 500 MG capsule Take 1 capsule (500 mg total) by mouth 2 (two) times daily. (Patient not taking: Reported on 08/06/2017) 14 capsule 0    neomycin -polymyxin-hydrocortisone  (CORTISPORIN) otic solution Place 4 drops into both ears 4 (four) times daily. (Patient not taking: Reported on 11/30/2016) 10 mL 0    ondansetron  (ZOFRAN  ODT) 4 MG disintegrating tablet Take 1 tablet (4 mg total) by mouth every 8 (eight) hours as needed for nausea or vomiting. (Patient not taking: Reported on 08/06/2017) 10 tablet 0    traMADol  (ULTRAM ) 50 MG tablet Take 1 tablet (50 mg total) by mouth every 8 (eight) hours as needed. (Patient  not taking: Reported on 11/30/2016) 15 tablet 0     Review of Systems  Gastrointestinal:  Positive for abdominal pain. Negative for constipation.  Genitourinary:  Positive for vaginal bleeding and vaginal discharge.   Physical Exam   Blood pressure 97/63, pulse 77, temperature 98.6 F (37 C), temperature source Oral, resp. rate 20, last menstrual period 11/18/2023, SpO2 100%.  Physical Exam Vitals and nursing note reviewed.  Constitutional:      Appearance: Normal appearance.  HENT:     Head: Normocephalic and atraumatic.     Nose: No congestion or rhinorrhea.  Eyes:     Extraocular Movements: Extraocular movements intact.  Cardiovascular:     Rate and Rhythm: Normal rate.  Pulmonary:     Effort: Pulmonary effort is normal.  Abdominal:     Palpations: Abdomen is soft.     Tenderness: There is no abdominal tenderness.  Musculoskeletal:        General: Normal range of motion.     Cervical back: Normal range of motion.  Skin:  General: Skin is warm.     Capillary Refill: Capillary refill takes less than 2 seconds.  Neurological:     General: No focal deficit present.     Mental Status: She is alert.     Cranial Nerves: No cranial nerve deficit.  Psychiatric:        Mood and Affect: Mood normal.        Behavior: Behavior normal.     MAU Course  Procedures  MDM CBC hCG Ultrasound  Assessment and Plan  Stefanie Ortiz is a 38 yo G3P0 @ [redacted]w[redacted]d presenting for vaginal bleeding in early pregnancy  Threatened miscarriage Vaginal bleeding in early pregnancy 1 week of vaginal bleeding and abnormal discharge.  UPT +1-week ago.  hCG today 47,029.  CBC showing normal white count and hemoglobin.  Wet prep negative.  Ultrasound showing single live IUP measuring 5 weeks and 6 days.  Heart rate 90 bpm.  No subchorionic hemorrhage appreciated.  Discussed with patient possible causes of the bleeding.  Discussed that this may be early signs of a miscarriage but could also be  another source of bleeding.  Discussed viability ultrasound in 2 weeks and patient request this.  Message sent to [facility] to have scheduled for viability ultrasound.  Return MAU precautions given.  No further questions or concerns.  Stefanie Ortiz 12/25/2023, 3:33 PM

## 2023-12-26 ENCOUNTER — Ambulatory Visit: Payer: Self-pay | Admitting: Family Medicine

## 2023-12-26 LAB — GC/CHLAMYDIA PROBE AMP (~~LOC~~) NOT AT ARMC
Chlamydia: NEGATIVE
Comment: NEGATIVE
Comment: NORMAL
Neisseria Gonorrhea: NEGATIVE

## 2024-01-07 ENCOUNTER — Other Ambulatory Visit: Payer: Self-pay

## 2024-01-07 DIAGNOSIS — O3680X0 Pregnancy with inconclusive fetal viability, not applicable or unspecified: Secondary | ICD-10-CM

## 2024-01-08 ENCOUNTER — Other Ambulatory Visit

## 2024-01-08 ENCOUNTER — Other Ambulatory Visit: Payer: Self-pay

## 2024-01-08 ENCOUNTER — Other Ambulatory Visit: Payer: Self-pay | Admitting: Family Medicine

## 2024-01-08 DIAGNOSIS — O3680X0 Pregnancy with inconclusive fetal viability, not applicable or unspecified: Secondary | ICD-10-CM

## 2024-01-08 DIAGNOSIS — Z3491 Encounter for supervision of normal pregnancy, unspecified, first trimester: Secondary | ICD-10-CM | POA: Diagnosis not present

## 2024-01-08 DIAGNOSIS — Z3A08 8 weeks gestation of pregnancy: Secondary | ICD-10-CM

## 2024-01-13 ENCOUNTER — Encounter: Payer: Self-pay | Admitting: Family Medicine

## 2024-01-13 ENCOUNTER — Other Ambulatory Visit: Payer: Self-pay

## 2024-01-13 ENCOUNTER — Telehealth: Payer: Self-pay

## 2024-01-13 ENCOUNTER — Ambulatory Visit (INDEPENDENT_AMBULATORY_CARE_PROVIDER_SITE_OTHER): Admitting: Advanced Practice Midwife

## 2024-01-13 VITALS — BP 110/73 | HR 75 | Wt 103.3 lb

## 2024-01-13 DIAGNOSIS — D259 Leiomyoma of uterus, unspecified: Secondary | ICD-10-CM | POA: Diagnosis not present

## 2024-01-13 DIAGNOSIS — O3410 Maternal care for benign tumor of corpus uteri, unspecified trimester: Secondary | ICD-10-CM

## 2024-01-13 DIAGNOSIS — Z3491 Encounter for supervision of normal pregnancy, unspecified, first trimester: Secondary | ICD-10-CM

## 2024-01-13 DIAGNOSIS — Z3A01 Less than 8 weeks gestation of pregnancy: Secondary | ICD-10-CM | POA: Diagnosis not present

## 2024-01-13 NOTE — Telephone Encounter (Signed)
 Pt left voicemail requesting that a doctor call her to discuss her results.  She had a follow up viability ultrasound 01/08/24.   Pt had MAU visit for bleeding on 12/25/23, 01/08/24 is follow up on viability.     Waddell, RN

## 2024-01-13 NOTE — Telephone Encounter (Signed)
 RN returned pt call.  Pt reports being very anxious about the ultrasound report she read in MyChart and wants to speak with a doctor about results and potential risk factors if she chose to terminate pregnancy.  RN reviewed ultrasound report with patient, she reports she previously knew about the fibroids.  She is concerned about the sub chorionic hemorrhage, corpus luteum, both were  explained by RN.  Pt then stated that she was shown on ultrasound screen that she has a pelvic kidney and didn't know about this previously and still desires to consult with a doctor about results.  Dr. Lola agreeable to see patient this afternoon in cancellation spot.  Pt verbally confirmed appointment with Dr. Lola 01/13/24 at 3:35pm.    Waddell, RN

## 2024-01-14 ENCOUNTER — Encounter: Payer: Self-pay | Admitting: Advanced Practice Midwife

## 2024-01-14 NOTE — Progress Notes (Signed)
 Ultrasounds Results Note  SUBJECTIVE HPI:  Ms. Stefanie Ortiz is a 38 y.o. G1P0 at [redacted]w[redacted]d by LMP and 5-6 week US  who presents to [facility] for followup ultrasound results. The patient denies abdominal pain or vaginal bleeding.  Upon review of the patient's records, patient was first seen in MAU on 12/25/23 for vaginal bleeding. US  showed 5.6 week live IUP with bradycardia. F/U US  scheduled to verify fetal viability. Pt is strongly considering termination for personal reasons and is also very concerned that this would be a high risk pregnancy for her due to fibroids. Also has questions about implications of pregnancy with pelvic kidney, Scoliosis.  States she has had consult appt with [facility] and considered switching care to [facility]. Concerned about preexisting medical conditions and cost.    Previous HCG's Lab Results  Component Value Date   HCGBETAQNT 47,029 (H) 12/25/2023   Repeat ultrasound was performed earlier today.   Past Medical History:  Diagnosis Date   Fibroid uterus    Scoliosis    No past surgical history on file. Social History   Socioeconomic History   Marital status: Single    Spouse name: Not on file   Number of children: Not on file   Years of education: Not on file   Highest education level: Not on file  Occupational History   Not on file  Tobacco Use   Smoking status: Former   Smokeless tobacco: Never  Substance and Sexual Activity   Alcohol use: Yes    Comment: rare   Drug use: No   Sexual activity: Not on file  Other Topics Concern   Not on file  Social History Narrative   Not on file   Social Drivers of Health   Financial Resource Strain: Not on file  Food Insecurity: Food Insecurity Present (01/13/2024)   Hunger Vital Sign    Worried About Running Out of Food in the Last Year: Sometimes true    Ran Out of Food in the Last Year: Never true  Transportation Needs: No Transportation Needs (01/13/2024)   PRAPARE  - Administrator, Civil Service (Medical): No    Lack of Transportation (Non-Medical): No  Physical Activity: Not on file  Stress: Not on file  Social Connections: Not on file  Intimate Partner Violence: Not on file   Current Outpatient Medications on File Prior to Visit  Medication Sig Dispense Refill   sertraline (ZOLOFT) 50 MG tablet Take 50 mg by mouth daily. (Patient not taking: Reported on 01/13/2024)     No current facility-administered medications on file prior to visit.   No Known Allergies  I have reviewed patient's Past Medical Hx, Surgical Hx, Family Hx, Social Hx, medications and allergies.   Review of Systems Review of Systems  Constitutional: Negative for fever and chills.  Gastrointestinal: Negative for abdominal pain.  Genitourinary: Negative for vaginal bleeding.  Musculoskeletal: Negative for back pain.  Neurological: Negative for dizziness and weakness.    Physical Exam  BP 110/73   Pulse 75   Wt 103 lb 4.8 oz (46.9 kg)   LMP 11/18/2023   BMI 18.89 kg/m   Patient's last menstrual period was 11/18/2023. GENERAL: Well-developed, well-nourished female in no acute distress.  HEENT: Normocephalic, atraumatic.   LUNGS: Effort normal ABDOMEN: Deferred HEART: Regular rate  SKIN: Warm, dry and without erythema PSYCH: Normal mood and affect NEURO: Alert and oriented x 4  LAB RESULTS No results found for this or  any previous visit (from the past 24 hours).  IMAGING US  OB Comp Less 14 Wks Result Date: 01/12/2024 ----------------------------------------------------------------------  OBSTETRICS REPORT                       (Signed Final 01/12/2024 11:48 am) ---------------------------------------------------------------------- Patient Info  ID #:       [id number]                          D.O.B.:  12-10-1985 (38 yrs)(F)  Name:       Stefanie Ortiz             Visit Date: 01/08/2024 05:06 pm  ---------------------------------------------------------------------- Performed By  Attending:        Winton Felt MD      Ref. Address:     [address]                                                             [place], [place]                                                             [address]  Performed By:     Scarlet Flesher         [facility]  Referred By:      [facility] ---------------------------------------------------------------------- Orders  #  Description  Code        Ordered By  1  US  OB COMP LESS 14 WKS                76801.0     TANYA PRATT ----------------------------------------------------------------------  #  Order #                     Accession #                Episode #  1  [id number]                   [id number]                 [id number] ---------------------------------------------------------------------- Indications  Weeks of gestation of pregnancy not            Z3A.00  specified  Encounter for uncertain dates                  Z36.87  Pregnancy with inconclusive fetal viability    O36.80X0 ---------------------------------------------------------------------- Fetal Evaluation  Num Of Fetuses:         1  Preg. Location:         Intrauterine  Gest. Sac:              Intrauterine  Yolk Sac:               Visualized  Fetal Pole:             Visualized  Fetal Heart Rate(bpm):  147  Cardiac Activity:       Observed  Comment:    Small subchorionic hemorrhage noted. 2 fibroids seen in ut w/              largest meas 5cm. Probable corpus luteum LT ovary ---------------------------------------------------------------------- Biometry  CRL:       15.5  mm     G. Age:  8w 0d                   EDD:   08/19/24 ---------------------------------------------------------------------- OB History  Gravidity:    2         Term:   0        Prem:   0        SAB:   1  TOP:          0       Ectopic:  0        Living: 0 ---------------------------------------------------------------------- Impression  Viable intrauterine pregnancy ---------------------------------------------------------------------- Recommendations  Routine prenatal care ----------------------------------------------------------------------                Winton Felt, MD Electronically Signed Final Report   01/12/2024 11:48 am ----------------------------------------------------------------------   US  OB LESS THAN 14 WEEKS WITH OB TRANSVAGINAL Result Date: 12/25/2023 CLINICAL DATA:  Abdominal pain.  First trimester of pregnancy. EXAM: OBSTETRIC <14 WK US  AND TRANSVAGINAL OB US  TECHNIQUE: Both transabdominal and transvaginal ultrasound examinations were performed for complete evaluation of the gestation as well as the maternal uterus, adnexal regions, and pelvic cul-de-sac. Transvaginal technique was performed to assess early pregnancy. COMPARISON:  None Available. FINDINGS: Intrauterine gestational sac: Single Yolk sac:  Visualized. Embryo:  Visualized. Cardiac Activity: Visualized. Heart Rate: 90 bpm CRL:  2.7 mm   5 w   6 d                  US  EDC: Aug 20, 2024. Subchorionic hemorrhage:  None visualized.  Maternal uterus/adnexae: Ovaries are unremarkable. No free fluid is noted. Fibroids are noted with the largest measuring 4.1 cm. IMPRESSION: Single live intrauterine gestation of 5 weeks 6 days. Electronically Signed   By: Lynwood Landy Raddle M.D.   On: 12/25/2023 14:24    ASSESSMENT 1. Normal IUP (intrauterine pregnancy) on prenatal ultrasound, first trimester   2. Uterine fibroid during pregnancy, antepartum     PLAN Explained that that fibroids are unlikely to increase risk in pregnancy but  support pt's choice of how to proceed with pregnancy. Will discuss US  findings with MD to confirm that there is no change in recommendation for pregnancy  management.  Discussed options or PB care vs termination options including medical termination or D&C. Current medical conditions do not affect/limit options or recommendations.  Go to MAU as needed for heavy bleeding, abdominal pain or fever greater than 100.4.  Shaquna Geigle  Claudene, CNM 01/14/2024 4:09 PM

## 2024-02-06 ENCOUNTER — Other Ambulatory Visit (INDEPENDENT_AMBULATORY_CARE_PROVIDER_SITE_OTHER): Payer: Self-pay

## 2024-02-06 ENCOUNTER — Ambulatory Visit: Admitting: *Deleted

## 2024-02-06 VITALS — BP 98/66 | HR 63 | Wt 106.4 lb

## 2024-02-06 DIAGNOSIS — Z3A11 11 weeks gestation of pregnancy: Secondary | ICD-10-CM | POA: Diagnosis not present

## 2024-02-06 DIAGNOSIS — Z362 Encounter for other antenatal screening follow-up: Secondary | ICD-10-CM | POA: Diagnosis not present

## 2024-02-06 DIAGNOSIS — Z3480 Encounter for supervision of other normal pregnancy, unspecified trimester: Secondary | ICD-10-CM

## 2024-02-06 DIAGNOSIS — O219 Vomiting of pregnancy, unspecified: Secondary | ICD-10-CM

## 2024-02-06 MED ORDER — PROMETHAZINE HCL 25 MG PO TABS: 25.0000 mg | ORAL_TABLET | Freq: Four times a day (QID) | ORAL | 1 refills | Status: DC | PRN

## 2024-02-06 NOTE — Progress Notes (Signed)
 New OB Intake  I connected with Stefanie Ortiz  on 02/06/24 at  8:15 AM EDT by In Person Visit and verified that I am speaking with the correct person using two identifiers. Nurse is located at [facility] and pt is located at [facility].  I discussed the limitations, risks, security and privacy concerns of performing an evaluation and management service by telephone and the availability of in person appointments. I also discussed with the patient that there may be a patient responsible charge related to this service. The patient expressed understanding and agreed to proceed.  I explained I am completing New OB Intake today. We discussed EDD of 08/24/24 based on LMP C/W US  at [redacted]w[redacted]d. Pt is G1P0. I reviewed her allergies, medications and Medical/Surgical/OB history.    Patient Active Problem List   Diagnosis Date Noted   Arm mass 11/23/2012   CERVICALGIA 09/05/2009   MUSCLE SPASM, TRAPEZIUS 09/05/2009   ALLERGIC RHINITIS DUE TO POLLEN 06/27/2009   Idiopathic scoliosis and kyphoscoliosis 06/27/2009     Concerns addressed today  Delivery Plans Plans to deliver at [facility]. Discussed the nature of our practice with multiple providers including residents and students as well as female and female providers. Due to the size of the practice, the delivering provider may not be the same as those providing prenatal care.   Patient is interested in water birth.  MyChart/Babyscripts MyChart access verified. I explained pt will have some visits in office and some virtually. Babyscripts instructions given and order placed. Patient verifies receipt of registration text/e-mail. Account successfully created and app downloaded. If patient is a candidate for Optimized scheduling, add to sticky note.   Blood Pressure Cuff/Weight Scale Blood pressure cuff ordered for patient to pick-up from [employer]. Explained after first prenatal appt pt will check weekly and document in Babyscripts. Patient does not have  weight scale; patient may purchase if they desire to track weight weekly in Babyscripts.  Anatomy US  Explained first scheduled US  will be around 19 weeks. Anatomy US  scheduled for TBD at TBD.  Is patient a candidate for Babyscripts Optimization? No, due to Risk Factors   First visit review I reviewed new OB appt with patient. Explained pt will be seen by Dr. Rudy at first visit. Discussed Stefanie Ortiz genetic screening with patient. Requests Panor[employer] and [employer].. Routine prenatal labs collected at today's visit.   Last Pap Diagnosis  Date Value Ref Range Status  07/23/2017   Final   NEGATIVE FOR INTRAEPITHELIAL LESIONS OR MALIGNANCY.    Stefanie [name], RN 02/06/2024  8:25 AM

## 2024-02-06 NOTE — Patient Instructions (Addendum)
 Medicine for Nausea: Unisom plus Vit B6  Unisom 25mg  1 tablet at bedtime, it can be increased to 1/2 tablet in morning, 1/2 tablet in afternoon, and 1 bedtime,  B6 100mg  take 1 tablet twice a day  [facility] [employer] has a partnership with the [facility] [employer] to provide prenatal navigation for the most needed resources in our community. In order to see how we can help connect you to these resources we need consent to contact you. Please complete the very short consent using the link below:   English Link: https://[place].tfaforms.net/283?site=16  Spanish Link: https://[place].tfaforms.net/287?site=16  Options for Doula Care in the [place]  As you review your birthing options, consider having a birth doula. A doula is trained to provide support before, during and just after you give birth. There are also postpartum doulas that help you adjust to new parenthood.  While doulas do not provide medical care, they do provide emotional, physical and educational support. A few months before your baby arrives, doulas can help answer questions, ease concerns and help you create and support your birthing plan.    Doulas can help reduce your stress and comfort you and your partner. They can help you cope with labor by helping you use breathing techniques, massage, creative labor positioning, essential oils and affirmations.   Studies show that the benefits of having a doula include:   A more positive birth experience  Fewer requests for pain-relief medication  Less likelihood of cesarean section, commonly called a c-section   Doulas are typically hired via a advertising account planner between you and the doula. We are happy to provide a list of the most active doulas in the area, all of whom are credentialed by [facility] and will not count as a visitor at your birth.  There are several options for no-cost doula care at our hospital, including:  [facility] Volunteer Doula Program Every  [employer] Program A Cure 4 Moms Doula Study (available only at [employer] for [facility], [place], [place] and [employer] [facility] offices)  For more information on these programs or to receive a list of doulas active in our area, please email [email or web address]

## 2024-02-07 LAB — CBC/D/PLT+RPR+RH+ABO+RUBIGG...
Antibody Screen: NEGATIVE
Basophils Absolute: 0.1 x10E3/uL (ref 0.0–0.2)
Basos: 1 %
EOS (ABSOLUTE): 0 x10E3/uL (ref 0.0–0.4)
Eos: 0 %
HCV Ab: NONREACTIVE
HIV Screen 4th Generation wRfx: NONREACTIVE
Hematocrit: 36.4 % (ref 34.0–46.6)
Hemoglobin: 11.1 g/dL (ref 11.1–15.9)
Hepatitis B Surface Ag: NEGATIVE
Immature Grans (Abs): 0 x10E3/uL (ref 0.0–0.1)
Immature Granulocytes: 0 %
Lymphocytes Absolute: 2.1 x10E3/uL (ref 0.7–3.1)
Lymphs: 22 %
MCH: 23.2 pg — ABNORMAL LOW (ref 26.6–33.0)
MCHC: 30.5 g/dL — ABNORMAL LOW (ref 31.5–35.7)
MCV: 76 fL — ABNORMAL LOW (ref 79–97)
Monocytes Absolute: 0.6 x10E3/uL (ref 0.1–0.9)
Monocytes: 6 %
Neutrophils Absolute: 6.5 x10E3/uL (ref 1.4–7.0)
Neutrophils: 71 %
Platelets: 221 x10E3/uL (ref 150–450)
RBC: 4.78 x10E6/uL (ref 3.77–5.28)
RDW: 13.2 % (ref 11.7–15.4)
RPR Ser Ql: NONREACTIVE
Rh Factor: POSITIVE
Rubella Antibodies, IGG: 1.78 {index} (ref >0.99)
WBC: 9.3 x10E3/uL (ref 3.4–10.8)

## 2024-02-07 LAB — HEMOGLOBIN A1C
Est. average glucose Bld gHb Est-mCnc: 111 mg/dL
Hgb A1c MFr Bld: 5.5 % (ref 4.8–5.6)

## 2024-02-07 LAB — HCV INTERPRETATION

## 2024-02-08 LAB — CULTURE, OB URINE

## 2024-02-08 LAB — URINE CULTURE, OB REFLEX: Organism ID, Bacteria: NO GROWTH

## 2024-02-11 LAB — PANORAMA PRENATAL TEST FULL PANEL:PANORAMA TEST PLUS 5 ADDITIONAL MICRODELETIONS: FETAL FRACTION: 11.3

## 2024-02-18 LAB — HORIZON CUSTOM: REPORT SUMMARY: POSITIVE — AB

## 2024-02-19 ENCOUNTER — Encounter: Payer: Self-pay | Admitting: Obstetrics & Gynecology

## 2024-02-19 ENCOUNTER — Ambulatory Visit: Payer: Self-pay | Admitting: Obstetrics & Gynecology

## 2024-02-19 ENCOUNTER — Encounter: Payer: Self-pay | Admitting: Maternal & Fetal Medicine

## 2024-02-19 ENCOUNTER — Encounter: Payer: Self-pay | Admitting: Obstetrics

## 2024-02-19 ENCOUNTER — Ambulatory Visit: Admitting: Obstetrics

## 2024-02-19 ENCOUNTER — Other Ambulatory Visit: Payer: Self-pay | Admitting: Obstetrics

## 2024-02-19 VITALS — BP 100/60 | HR 92 | Wt 104.7 lb

## 2024-02-19 DIAGNOSIS — Q632 Ectopic kidney: Secondary | ICD-10-CM

## 2024-02-19 DIAGNOSIS — D508 Other iron deficiency anemias: Secondary | ICD-10-CM | POA: Diagnosis not present

## 2024-02-19 DIAGNOSIS — D563 Thalassemia minor: Secondary | ICD-10-CM

## 2024-02-19 DIAGNOSIS — M4120 Other idiopathic scoliosis, site unspecified: Secondary | ICD-10-CM

## 2024-02-19 DIAGNOSIS — O0991 Supervision of high risk pregnancy, unspecified, first trimester: Secondary | ICD-10-CM

## 2024-02-19 DIAGNOSIS — Z3A13 13 weeks gestation of pregnancy: Secondary | ICD-10-CM

## 2024-02-19 DIAGNOSIS — O0990 Supervision of high risk pregnancy, unspecified, unspecified trimester: Secondary | ICD-10-CM

## 2024-02-19 DIAGNOSIS — Z3480 Encounter for supervision of other normal pregnancy, unspecified trimester: Secondary | ICD-10-CM

## 2024-02-19 MED ORDER — ACCRUFER 30 MG PO CAPS: 1.0000 | ORAL_CAPSULE | Freq: Two times a day (BID) | ORAL | 3 refills | Status: DC

## 2024-02-19 NOTE — Progress Notes (Unsigned)
 Subjective:    Stefanie Ortiz is being seen today for her first obstetrical visit.  This is not a planned pregnancy. She is at [redacted]w[redacted]d gestation. Her obstetrical history is significant for NONE. Relationship with FOB: significant other, not living together. Patient does intend to breast feed. Pregnancy history fully reviewed.  The information documented in the HPI was reviewed and verified.  Menstrual History: OB History     Gravida  2   Para  0   Term  0   Preterm  0   AB  1   Living  0      SAB  1   IAB  0   Ectopic  0   Multiple  0   Live Births  0            Patient's last menstrual period was 11/18/2023 (approximate).    Past Medical History:  Diagnosis Date   Anxiety    Depression    Fibroid uterus    Scoliosis    Thalassemia alpha carrier 02/19/2024    Past Surgical History:  Procedure Laterality Date   LIPOMA EXCISION     Right shoulder, left lower back during same surgery    (Not in a hospital admission)  No Known Allergies  Social History   Tobacco Use   Smoking status: Former   Smokeless tobacco: Never  Substance Use Topics   Alcohol use: Not Currently    Comment: rare    Family History  Problem Relation Age of Onset   Heart Problems Mother    Prostate cancer Father    Diabetes Other    Hypertension Other    Stroke Other      Review of Systems Constitutional: negative for weight loss Gastrointestinal: negative for vomiting Genitourinary:negative for genital lesions and vaginal discharge and dysuria Musculoskeletal:negative for back pain Behavioral/Psych: negative for abusive relationship, depression, illegal drug usage and tobacco use    Objective:    LMP 11/18/2023 (Approximate)  General Appearance:    Alert, cooperative, no distress, appears stated age  Head:    Normocephalic, without obvious abnormality, atraumatic  Eyes:    PERRL, conjunctiva/corneas clear, EOM's intact, fundi    benign, both eyes  Ears:     Normal TM's and external ear canals, both ears  Nose:   Nares normal, septum midline, mucosa normal, no drainage    or sinus tenderness  Throat:   Lips, mucosa, and tongue normal; teeth and gums normal  Neck:   Supple, symmetrical, trachea midline, no adenopathy;    thyroid:  no enlargement/tenderness/nodules; no carotid   bruit or JVD  Back:     Symmetric, no curvature, ROM normal, no CVA tenderness  Lungs:     Clear to auscultation bilaterally, respirations unlabored  Chest Wall:    No tenderness or deformity   Heart:    Regular rate and rhythm, S1 and S2 normal, no murmur, rub   or gallop  Breast Exam:    No tenderness, masses, or nipple abnormality  Abdomen:     Soft, non-tender, bowel sounds active all four quadrants,    no masses, no organomegaly  Genitalia:    Normal female without lesion, discharge or tenderness  Extremities:   Extremities normal, atraumatic, no cyanosis or edema  Pulses:   2+ and symmetric all extremities  Skin:   Skin color, texture, turgor normal, no rashes or lesions  Lymph nodes:   Cervical, supraclavicular, and axillary nodes normal  Neurologic:   CNII-XII intact,  normal strength, sensation and reflexes    throughout      Lab Review Urine pregnancy test Labs reviewed {YES NO:22349} Radiologic studies reviewed {YES NO:22349}  Assessment:    Pregnancy at [redacted]w[redacted]d weeks    Plan:      Prenatal vitamins.  Counseling provided regarding continued use of seat belts, cessation of alcohol consumption, smoking or use of illicit drugs; infection precautions i.e., influenza/TDAP immunizations, toxoplasmosis,CMV, parvovirus, listeria and varicella; workplace safety, exercise during pregnancy; routine dental care, safe medications, sexual activity, hot tubs, saunas, pools, travel, caffeine use, fish and methlymercury, potential toxins, hair treatments, varicose veins Weight gain recommendations per IOM guidelines reviewed: underweight/BMI< 18.5--> gain 28 - 40 lbs;  normal weight/BMI 18.5 - 24.9--> gain 25 - 35 lbs; overweight/BMI 25 - 29.9--> gain 15 - 25 lbs; obese/BMI >30->gain  11 - 20 lbs Problem list reviewed and updated. FIRST/CF mutation testing/NIPT/QUAD SCREEN/fragile X/Ashkenazi Jewish population testing/Spinal muscular atrophy discussed: {requests/ordered/declines:14581}. Role of ultrasound in pregnancy discussed; fetal survey: {requests/ordered/declines:14581}. Amniocentesis discussed: {amniocentesis:14582}.  No orders of the defined types were placed in this encounter.  No orders of the defined types were placed in this encounter.   Follow up in {numbers 0-4:31231} weeks.  ***% of *** min visit spent on counseling and coordination of care.   CARLIN RONAL CENTERS, MD, FACOG Attending Obstetrician & Gynecologist, [facility] for [facility], [facility], [place] 02/19/2024

## 2024-02-19 NOTE — Progress Notes (Signed)
 Subjective:    Stefanie Ortiz is being seen today for her first obstetrical visit.  This is not a planned pregnancy. She is at [redacted]w[redacted]d gestation. Her obstetrical history is significant for NONE. Relationship with FOB: significant other, not living together. Patient does intend to breast feed. Pregnancy history fully reviewed.  The information documented in the HPI was reviewed and verified.  Menstrual History: OB History     Gravida  2   Para  0   Term  0   Preterm  0   AB  1   Living  0      SAB  1   IAB  0   Ectopic  0   Multiple  0   Live Births  0            Patient's last menstrual period was 11/18/2023 (approximate).    Past Medical History:  Diagnosis Date   Anxiety    Depression    Fibroid uterus    Scoliosis    Thalassemia alpha carrier 02/19/2024    Past Surgical History:  Procedure Laterality Date   LIPOMA EXCISION     Right shoulder, left lower back during same surgery    (Not in a hospital admission)  No Known Allergies  Social History   Tobacco Use   Smoking status: Former   Smokeless tobacco: Never  Substance Use Topics   Alcohol use: Not Currently    Comment: rare    Family History  Problem Relation Age of Onset   Heart Problems Mother    Prostate cancer Father    Diabetes Other    Hypertension Other    Stroke Other      Review of Systems Constitutional: negative for weight loss Gastrointestinal: negative for vomiting Genitourinary:negative for genital lesions and vaginal discharge and dysuria Musculoskeletal:negative for back pain Behavioral/Psych: negative for abusive relationship, depression, illegal drug usage and tobacco use    Objective:    BP 100/60   Pulse 92   Wt 104 lb 11.2 oz (47.5 kg)   LMP 11/18/2023 (Approximate)   BMI 19.15 kg/m  General Appearance:    Alert, cooperative, no distress, appears stated age  Head:    Normocephalic, without obvious abnormality, atraumatic  Eyes:    PERRL,  conjunctiva/corneas clear, EOM's intact, fundi    benign, both eyes  Ears:    Normal TM's and external ear canals, both ears  Nose:   Nares normal, septum midline, mucosa normal, no drainage    or sinus tenderness  Throat:   Lips, mucosa, and tongue normal; teeth and gums normal  Neck:   Supple, symmetrical, trachea midline, no adenopathy;    thyroid:  no enlargement/tenderness/nodules; no carotid   bruit or JVD  Back:     Symmetric, no curvature, ROM normal, no CVA tenderness  Lungs:     Clear to auscultation bilaterally, respirations unlabored  Chest Wall:    No tenderness or deformity   Heart:    Regular rate and rhythm, S1 and S2 normal, no murmur, rub   or gallop  Breast Exam:    No tenderness, masses, or nipple abnormality  Abdomen:     Soft, non-tender, bowel sounds active all four quadrants,    no masses, no organomegaly  Genitalia:    Normal female without lesion, discharge or tenderness  Extremities:   Extremities normal, atraumatic, no cyanosis or edema  Pulses:   2+ and symmetric all extremities  Skin:   Skin color, texture, turgor  normal, no rashes or lesions  Lymph nodes:   Cervical, supraclavicular, and axillary nodes normal  Neurologic:   CNII-XII intact, normal strength, sensation and reflexes    throughout      Lab Review Urine pregnancy test Labs reviewed yes Radiologic studies reviewed no  Assessment:    Pregnancy at [redacted]w[redacted]d weeks    Plan:      Prenatal vitamins.  Counseling provided regarding continued use of seat belts, cessation of alcohol consumption, smoking or use of illicit drugs; infection precautions i.e., influenza/TDAP immunizations, toxoplasmosis,CMV, parvovirus, listeria and varicella; workplace safety, exercise during pregnancy; routine dental care, safe medications, sexual activity, hot tubs, saunas, pools, travel, caffeine use, fish and methlymercury, potential toxins, hair treatments, varicose veins Weight gain recommendations per IOM  guidelines reviewed: underweight/BMI< 18.5--> gain 28 - 40 lbs; normal weight/BMI 18.5 - 24.9--> gain 25 - 35 lbs; overweight/BMI 25 - 29.9--> gain 15 - 25 lbs; obese/BMI >30->gain  11 - 20 lbs Problem list reviewed and updated. FIRST/CF mutation testing/NIPT/QUAD SCREEN/fragile X/Ashkenazi Jewish population testing/Spinal muscular atrophy discussed: requested. Role of ultrasound in pregnancy discussed; fetal survey: requested. Amniocentesis discussed: not indicated.  Meds ordered this encounter  Medications   Ferric Maltol (ACCRUFER) 30 MG CAPS    Sig: Take 1 capsule (30 mg total) by mouth 2 (two) times daily before a meal. Take 2 hrs before, or 2 hrs after a meal.    Dispense:  60 capsule    Refill:  3    Follow up in 4 weeks.  I have spent a total of 20 minutes of face-to-face time, excluding clinical staff time, reviewing notes and preparing to see patient, ordering tests and/or medications, and counseling the patient.   Stefanie RONAL CENTERS, MD, FACOG Attending Obstetrician & Gynecologist, [facility] for Stefanie Ortiz [facility], [facility], [place] 02/19/2024

## 2024-02-19 NOTE — Progress Notes (Signed)
 Pt presents for new ob. Pt is taking otc [facility]. Pt states that her last pap was done by her PCP on 04/26/2022

## 2024-02-25 ENCOUNTER — Ambulatory Visit: Payer: Self-pay | Admitting: Obstetrics

## 2024-02-25 LAB — CYTOLOGY - PAP
Adequacy: ABNORMAL
Comment: NEGATIVE

## 2024-02-26 ENCOUNTER — Ambulatory Visit: Attending: Obstetrics and Gynecology

## 2024-02-26 DIAGNOSIS — D563 Thalassemia minor: Secondary | ICD-10-CM

## 2024-02-26 DIAGNOSIS — O09522 Supervision of elderly multigravida, second trimester: Secondary | ICD-10-CM

## 2024-02-26 DIAGNOSIS — O09529 Supervision of elderly multigravida, unspecified trimester: Secondary | ICD-10-CM | POA: Insufficient documentation

## 2024-02-26 NOTE — Progress Notes (Signed)
 [facility] at [facility] [address], [address] Phone:  [phone]   Fax:  [phone]      In-Person Genetic Counseling Clinic Note:   I spoke with 38 y.o. Stefanie Ortiz today to discuss her carrier screening results. She was referred by Herchel Gloris LABOR, MD.   Pregnancy History:    G2P0010. EGA: [redacted]w[redacted]d by LMP. EDD: 08/24/2024. [name] had one SAB at ~8w of unknown etiology. Reports she takes PNVs. Personal history of right pelvic kidney, anxiety, and depression. Denies other major personal health concerns. Denies bleeding, infections, and fevers in this pregnancy. Denies using tobacco, alcohol, or street drugs in this pregnancy.   Family History:    A three-generation pedigree was created and scanned into Epic under the Media tab.  Patient reports her sister had positive trisomy 18 prenatal screening when she was pregnant. Her 29 yo nephew did not have postnatal genetic testing that she is aware of. He is generally healthy but has speech and developmental delays. We reviewed this may have been a false positive screen or may be a form of T18. Most cases of T18 occur by chance and are not inherited.  However, we do not have records to review and cannot rule out an inherited form of T18 or another genetic condition.  Patient reports her father and FOB's father both have a history of prostate cancer, u/k ages of diagnoses. Different types of cancer can have a hereditary component that increases an individual's susceptibility to develop cancer; however, most cancers occur by chance due to a combination of genetics and the environment. It is recommended they both inform their PCP about the family history so they can discuss appropriate screening and testing options, including a possible referral to cancer genetic counseling.  Patient reports her sister's daughter has postaxial polydactyly that was paternally inherited. We reviewed  isolated postaxial polydactyly typically presents as autosomal dominant in inheritance with reduced penetrance. If paternally inherited, we do not expect and increased chance for the fetus to develop polydactyly, but the anatomy ultrasound will screen for this.  If [name] learns of additional information regarding her family member's diagnosis, we are happy to revisit and discuss further.   Patient ethnicity reported as Black and FOB ethnicity reported as Black. Denies Ashkenazi Jewish ancestry.  Family history otherwise not remarkable for consanguinity, individuals with birth defects, intellectual disability, autism spectrum disorder, multiple spontaneous abortions, still births, or unexplained neonatal death.   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 Carriers may have mild anemia. She screened negative for the other three conditions (CF, SMA, and beta-hemoglobinopathies). Negative carrier screening reduces but does not eliminate the chance of being a carrier. The report can be seen in her medical chart.  We reviewed the genetics of alpha thalassemia autosomal recessive mode of inheritance, and clinical features of these conditions. We reviewed that [name] will pass down one functional copy of the alpha globin gene and one deletion (-?) in each pregnancy. Therefore, this pregnancy is not at increased risk for hemoglobin Bart's due to four deletions of the alpha globin genes (--/--) regardless of her reproductive partner's carrier status. The pregnancy may be at increased risk for hemoglobin H disease (50%) if her partner is an alpha thalassemia carrier in the cis configuration (--/??). We reviewed that this is  more common in Southeast Asian populations and less common in those with Black ancestry.  Given these results, we discussed and offered  carrier screening for [name]'s reproductive partner. We reviewed the benefits and limitations of carrier screening and that it can detect most but not all carriers.  She declined at this time, as he is not available for testing. We discussed other options, including prenatal diagnosis through amniocentesis, UNITY single gene NIPS, and postnatal testing. The benefits, risks, and limitations of each were reviewed including the 1 in 500 risk for miscarriage with prenatal diagnostic testing. We reviewed that the [place]  Newborn Screening (NBS) program will screen all newborn babies for cystic fibrosis, spinal muscular atrophy, hemoglobinopathies, and numerous other conditions but will not screen for alpha thalassemia. [name] declined amniocentesis. She is considering UNITY single gene NIPS, but declines at this time due to her current insurance and plans to wait until her Medicaid becomes active as her primary plan at the start of 2026. She will inform us  if she wishes to proceed with UNITY single gene NIPS.  Newborn Screening. The [place]  Newborn Screening (NBS) program will screen all newborn babies for cystic fibrosis, spinal muscular atrophy, hemoglobinopathies, and numerous other conditions. NBS does not screen for alpha thalassemia.  Advanced Maternal Age:  We briefly discussed that the chance that a fetus would be affected with a chromosome difference increases with advanced maternal age. [name]'s current age-related risk to have a pregnancy affected with a chromosome difference is approximately 1 in 105 (~1%). We discussed that this risk may also be lower given her low-risk NIPS results.  We discussed and offered the option of amniocentesis. [name] declined amniocentesis. We reviewed no screen/test can ensure a healthy pregnancy.   Previous Testing Completed:  Low risk NIPS: [name] previously completed [facility] noninvasive prenatal screening (NIPS) in this pregnancy. The  result is low risk, consistent with a female fetus. This screening significantly reduces but does not eliminate the chance that the current pregnancy has Down syndrome (trisomy 34), trisomy 55, trisomy 92, common sex chromosome conditions, and 22q11.2 microdeletion syndrome. The report can be seen in her medical chart. There are many genetic conditions that cannot be detected by NIPS.    Plan of Care:   Declined amniocentesis and FOB carrier screening. Patient will inform us  if she wishes to proceed with UNITY single gene NIPS for alpha thalassemia. Newborn screening. Postnatal referral to hematology as clinically indicated for the newborn.   Informed consent was obtained. All questions were answered.   75 minutes were spent on the date of the encounter in service to the patient including preparation, face-to-face consultation, discussion of test reports and available next steps, pedigree construction, genetic risk assessment, documentation, and care coordination.    Thank you for sharing in the care of Stefanie Ortiz with us .  Please do not hesitate to contact us  at [phone] if you have any questions.   Lauraine Bodily, MS, [facility] Certified Genetic Counselor   Genetic counseling student involved in appointment: No.

## 2024-02-27 ENCOUNTER — Other Ambulatory Visit: Payer: Self-pay | Admitting: *Deleted

## 2024-02-27 DIAGNOSIS — Z362 Encounter for other antenatal screening follow-up: Secondary | ICD-10-CM

## 2024-03-01 ENCOUNTER — Ambulatory Visit: Payer: Self-pay

## 2024-03-09 ENCOUNTER — Inpatient Hospital Stay (HOSPITAL_COMMUNITY)
Admission: AD | Admit: 2024-03-09 | Discharge: 2024-03-09 | Disposition: A | Discharge status: Home / Self Care | Attending: Obstetrics and Gynecology | Admitting: Obstetrics and Gynecology

## 2024-03-09 ENCOUNTER — Telehealth: Payer: Self-pay

## 2024-03-09 ENCOUNTER — Inpatient Hospital Stay (HOSPITAL_COMMUNITY)

## 2024-03-09 ENCOUNTER — Other Ambulatory Visit: Payer: Self-pay

## 2024-03-09 DIAGNOSIS — Z3A16 16 weeks gestation of pregnancy: Secondary | ICD-10-CM | POA: Diagnosis not present

## 2024-03-09 DIAGNOSIS — R209 Unspecified disturbances of skin sensation: Secondary | ICD-10-CM | POA: Diagnosis present

## 2024-03-09 DIAGNOSIS — R109 Unspecified abdominal pain: Secondary | ICD-10-CM | POA: Diagnosis present

## 2024-03-09 DIAGNOSIS — O26892 Other specified pregnancy related conditions, second trimester: Secondary | ICD-10-CM | POA: Insufficient documentation

## 2024-03-09 DIAGNOSIS — Q632 Ectopic kidney: Secondary | ICD-10-CM

## 2024-03-09 DIAGNOSIS — M47812 Spondylosis without myelopathy or radiculopathy, cervical region: Secondary | ICD-10-CM | POA: Diagnosis not present

## 2024-03-09 DIAGNOSIS — Z87891 Personal history of nicotine dependence: Secondary | ICD-10-CM | POA: Insufficient documentation

## 2024-03-09 DIAGNOSIS — R202 Paresthesia of skin: Secondary | ICD-10-CM | POA: Insufficient documentation

## 2024-03-09 NOTE — Telephone Encounter (Signed)
 Return TC to pt. Pt states that she has noticed her BP running low (80s/90s over low 40s/50s). Pt has had low BP as well in office occasionally. Pt does not report any dizziness. Pt does report right arm tingling/numbness. Been this way since 0830 this AM and has not went away. Pt did have episode of chest pain this morning that she noticed, only had the one episode. Pt concerned and wondering what she should do. Informed pt to go into MAU for evaluation due to right arm tingling/numbness and episode of chest pain.

## 2024-03-09 NOTE — Discharge Instructions (Addendum)
 Your MRI was negative for something acute. There were some changes that are not normal for a young woman your age. I have referred you to the local neurology group to have an appointment to talk about your MRI changes and to possibly get a nerve conduction studies.    You should hear from them in the next 2 weeks.

## 2024-03-09 NOTE — MAU Note (Addendum)
 Stefanie Ortiz is a 38 y.o. at [redacted]w[redacted]d here in MAU reporting: she had chest pain in between her breasts that began this morning, states has since subsided.  Reports now having numbness in right arm & intermittent sharp abdominal pain/cramping near umbilicus.  Denies VB and LOF.  LMP: 11/18/2023 Onset of complaint: today Pain score: 5 Vitals:   03/09/24 1555  BP: (!) 111/54  Pulse: 85  Resp: 18  Temp: 98.5 F (36.9 C)  SpO2: 100%     FHT: 144 bpm  Lab orders placed from triage: UA

## 2024-03-09 NOTE — MAU Provider Note (Signed)
 History     CSN: [id number]  Arrival date and time: 03/09/24 1539   Event Date/Time   First Provider Initiated Contact with Patient 03/09/24 1622      Chief Complaint  Patient presents with   Chest Pain   Abdominal Pain    38 y.o. G2P0010 [redacted]w[redacted]d here with complaints of   #Abdominal pain: intermittently, located under belly button. Mild in nature and sporadic. She was thinking this might be round ligament pain and also was not sure.  #Right arm, decreased sensation: she woke up this morning and her arm felt cold and has decreased sensation. Denies motor dysfunction or weakness. She reports no pain in neck. Denies HA. Denies changes in vision, speech, hearing. Denies dizziness, syncope. Denies trauma to area. Grasp is intact.  Patient is right handed and no issues with writing. She reports she thought it would improve with time but it has been persistent.    Abdominal Pain This is a recurrent problem. The current episode started 1 to 4 weeks ago. The onset quality is gradual. The problem occurs intermittently. Pertinent negatives include no diarrhea, nausea or vomiting. Nothing aggravates the pain. The pain is relieved by Nothing. She has tried nothing for the symptoms.     OB History     Gravida  2   Para  0   Term  0   Preterm  0   AB  1   Living  0      SAB  1   IAB  0   Ectopic  0   Multiple  0   Live Births  0           Past Medical History:  Diagnosis Date   Anxiety    Depression    Fibroid uterus    Scoliosis    Thalassemia alpha carrier 02/19/2024    Past Surgical History:  Procedure Laterality Date   LIPOMA EXCISION     Right shoulder, left lower back during same surgery    Family History  Problem Relation Age of Onset   Heart Problems Mother    Prostate cancer Father    Diabetes Other    Hypertension Other    Stroke Other     Social History   Tobacco Use   Smoking status: Former   Smokeless tobacco: Never  Vaping Use    Vaping status: Never Used  Substance Use Topics   Alcohol use: Not Currently    Comment: rare   Drug use: No    Allergies: No Known Allergies  Medications Prior to Admission  Medication Sig Dispense Refill Last Dose/Taking   Prenatal Vit-Fe Fumarate-FA (MULTIVITAMIN-PRENATAL) 27-0.8 MG TABS tablet Take 1 tablet by mouth daily at 12 noon.   03/08/2024 at  9:00 PM   Ferric Maltol  (ACCRUFER ) 30 MG CAPS Take 1 capsule (30 mg total) by mouth 2 (two) times daily before a meal. Take 2 hrs before, or 2 hrs after a meal. 60 capsule 3    promethazine  (PHENERGAN ) 25 MG tablet Take 1 tablet (25 mg total) by mouth every 6 (six) hours as needed for nausea or vomiting. (Patient not taking: Reported on 02/19/2024) 30 tablet 1    sertraline (ZOLOFT) 50 MG tablet Take 50 mg by mouth daily. (Patient not taking: Reported on 02/06/2024)      sertraline (ZOLOFT) 50 MG tablet Take 50 mg by mouth daily. (Patient not taking: Reported on 02/06/2024)       Review of Systems  Gastrointestinal:  Positive for abdominal pain. Negative for diarrhea, nausea and vomiting.   Physical Exam   Blood pressure (!) 111/54, pulse 85, temperature 98.5 F (36.9 C), temperature source Oral, resp. rate 18, height 5' 2.5 (1.588 m), weight 49.4 kg, last menstrual period 11/18/2023, SpO2 100%.  Physical Exam Vitals and nursing note reviewed.  Constitutional:      Appearance: Normal appearance.  HENT:     Head: Normocephalic and atraumatic.     Nose: Nose normal.     Mouth/Throat:     Mouth: Mucous membranes are moist.  Eyes:     General: No visual field deficit.    Conjunctiva/sclera: Conjunctivae normal.  Cardiovascular:     Rate and Rhythm: Normal rate.     Pulses: Normal pulses.          Radial pulses are 2+ on the right side and 2+ on the left side.     Heart sounds: Normal heart sounds.  Pulmonary:     Effort: Pulmonary effort is normal.  Abdominal:     General: Abdomen is flat.     Palpations: Abdomen is soft.   Musculoskeletal:     Cervical back: Normal range of motion.     Right lower leg: No edema.     Left lower leg: No edema.  Skin:    General: Skin is warm.     Capillary Refill: Capillary refill takes less than 2 seconds.  Neurological:     General: No focal deficit present.     Mental Status: She is alert and oriented to person, place, and time. Mental status is at baseline.     GCS: GCS eye subscore is 4. GCS verbal subscore is 5. GCS motor subscore is 6.     Cranial Nerves: Cranial nerves 2-12 are intact. No cranial nerve deficit, dysarthria or facial asymmetry.     Sensory: Sensory deficit (Decreased sensation to light touch on entire right upper extermity. Decreased pinprick sensation in right upper extremity) present.     Motor: Motor function is intact. No weakness (5/5 strength in bilateral upper extremities- tested grip, wrist flex/ext, bicep, deltoid).     Coordination: Coordination is intact.     Gait: Gait is intact.     Comments: Tested light touch - bilateral hands, forearm, upper arm Tested pinprink - bilateral hands-- tested at space between 1 and 2nd digit.   Notes entire right side 3/5  and left side 5/5 sensation  Psychiatric:        Mood and Affect: Mood normal.     MAU Course  Procedures  MDM-high New onset sensory deficit-- ddx includes nerve impingement, stroke, complex migraine - consulted neurology given sensory deficit, new onset. They recommend MRI brain and C spine.   MR Cervical Spine Wo Contrast Result Date: 03/09/2024 EXAM: MRI CERVICAL SPINE WITHOUT CONTRAST 03/09/2024 07:03:22 PM TECHNIQUE: Multiplanar multisequence MRI of the cervical spine was performed. COMPARISON: None available. CLINICAL HISTORY: Right upper extremity sensory deficit. [redacted] weeks pregnant. FINDINGS: The examination is motion degraded, severely so on axial sequences and mildly to moderately on sagittal sequences. BONES AND ALIGNMENT: Mild scoliosis. No significant listhesis. No  fracture, suspicious marrow lesion, or significant marrow edema. SPINAL CORD: Limited assessment of the spinal cord on both axial sequences and the sagittal STIR sequence due to motion. Normal cord signal and size on the standard sagittal T2 sequence. SOFT TISSUES: No paraspinal mass. DISC LEVELS: Mild disc bulging and uncovertebral spurring at C3-C4, C4-C5, and C5-C6. No evidence of  significant stenosis. IMPRESSION: 1. Grossly normal appearance of the cervical spinal cord on this motion-degraded examination. 2. Mild cervical spondylosis without stenosis. Electronically signed by: Dasie Hamburg MD 03/09/2024 07:49 PM EST RP Workstation: [id number]   MR BRAIN WO CONTRAST Result Date: 03/09/2024 EXAM: MRI BRAIN WITHOUT CONTRAST 03/09/2024 06:51:36 PM TECHNIQUE: Multiplanar multisequence MRI of the head/brain was performed without the administration of intravenous contrast. COMPARISON: None available. CLINICAL HISTORY: Sensory deficit right upper extremity. [redacted] weeks pregnant. FINDINGS: The examination is mildly motion degraded. BRAIN AND VENTRICLES: There is no evidence of an acute infarct, intracranial hemorrhage, mass, midline shift, hydrocephalus, or extra-axial fluid collection. Cerebral volume is normal. Scattered punctate foci of T2 FLAIR hyperintensity are present in the subcortical cerebral white matter bilaterally, overall mild but abnormal for age. The brainstem and cerebellum are normal in signal. Major intracranial vascular flow voids are preserved. ORBITS: No acute abnormality. SINUSES AND MASTOIDS: No acute abnormality. BONES AND SOFT TISSUES: Normal marrow signal. No acute soft tissue abnormality. IMPRESSION: 1. No acute intracranial abnormality. 2. Scattered punctate T2 hyperintensities in the cerebral white matter, mild but abnormal for age and nonspecific. Considerations include premature chronic small vessel ischemic disease, complicated migraines, vasculitis, demyelinating disease, and sequelae of  previous infection/inflammation. Electronically signed by: Dasie Hamburg MD 03/09/2024 07:45 PM EST RP Workstation: [id number]   8:06 PM reviewed results with patient. Discussed referral to neurology given scattered areas in white matter.   Assessment and Plan   1. Sensation disorder   2. [redacted] weeks gestation of pregnancy    Discharged home stable No acute intracerebral process Referral to neurology placed  [facility] 03/09/2024, 4:22 PM

## 2024-03-17 ENCOUNTER — Ambulatory Visit: Admitting: Physician Assistant

## 2024-03-17 VITALS — BP 105/64 | HR 74 | Wt 110.9 lb

## 2024-03-17 DIAGNOSIS — O09522 Supervision of elderly multigravida, second trimester: Secondary | ICD-10-CM | POA: Diagnosis not present

## 2024-03-17 DIAGNOSIS — Z3480 Encounter for supervision of other normal pregnancy, unspecified trimester: Secondary | ICD-10-CM

## 2024-03-17 DIAGNOSIS — Q632 Ectopic kidney: Secondary | ICD-10-CM

## 2024-03-17 DIAGNOSIS — D563 Thalassemia minor: Secondary | ICD-10-CM | POA: Diagnosis not present

## 2024-03-17 DIAGNOSIS — R208 Other disturbances of skin sensation: Secondary | ICD-10-CM | POA: Diagnosis not present

## 2024-03-17 DIAGNOSIS — Z3A17 17 weeks gestation of pregnancy: Secondary | ICD-10-CM

## 2024-03-17 NOTE — Progress Notes (Signed)
 PRENATAL VISIT NOTE  Subjective:  Stefanie Ortiz is a 38 y.o. G2P0010 at [redacted]w[redacted]d being seen today for ongoing prenatal care.  She is currently monitored for the following issues for this low-risk pregnancy and has Idiopathic scoliosis and kyphoscoliosis; Arm mass; Supervision of other normal pregnancy, antepartum; Thalassemia alpha carrier; Advanced maternal age in multigravida; Single pelvic kidney- Right; and Sensation disorder on their problem list.  Patient reports no complaints.  Contractions: Irritability. Vag. Bleeding: None.  Movement: Present. Denies leaking of fluid.   The following portions of the patient's history were reviewed and updated as appropriate: allergies, current medications, past family history, past medical history, past social history, past surgical history and problem list.   Objective:   Vitals:   03/17/24 1536  BP: 105/64  Pulse: 74  Weight: 110 lb 14.4 oz (50.3 kg)    Fetal Status:  Fetal Heart Rate (bpm): 147   Movement: Present    General: Alert, oriented and cooperative. Patient is in no acute distress.  Skin: Skin is warm and dry. No rash noted.   Cardiovascular: Normal heart rate noted  Respiratory: Normal respiratory effort, no problems with respiration noted  Abdomen: Soft, gravid, appropriate for gestational age.  Pain/Pressure: Present (Back pain)     Pelvic: Cervical exam deferred        Extremities: Normal range of motion.  Edema: None  Mental Status: Normal mood and affect. Normal behavior. Normal judgment and thought content.      02/06/2024    9:00 AM 01/13/2024    4:14 PM 09/17/2017    9:47 AM  Depression screen PHQ 2/9  Decreased Interest 0 1 0  Down, Depressed, Hopeless 0 1 0  PHQ - 2 Score 0 2 0  Altered sleeping 0 2   Tired, decreased energy 0 2   Change in appetite 2 1   Feeling bad or failure about yourself  0 1   Trouble concentrating 1 1   Moving slowly or fidgety/restless 0 0   Suicidal thoughts 0 0   PHQ-9 Score  3  9       Data saved with a previous flowsheet row definition        02/06/2024    9:03 AM 01/13/2024    4:15 PM  GAD 7 : Generalized Anxiety Score  Nervous, Anxious, on Edge 1 1  Control/stop worrying 1 1  Worry too much - different things 1 1  Trouble relaxing 1 1  Restless 0 0  Easily annoyed or irritable 0 1  Afraid - awful might happen 0 2  Total GAD 7 Score 4 7    Assessment and Plan:  Pregnancy: G2P0010 at [redacted]w[redacted]d  1. Supervision of other normal pregnancy, antepartum (Primary) Patient doing well  BP, FHR appropriate  - Cytology - PAP( [facility]) - AFP, Serum, Open Spina Bifida  2. [redacted] weeks gestation of pregnancy Anticipatory guidance about next visits/weeks of pregnancy given.   3. Single pelvic kidney- Right  4. Thalassemia alpha carrier See genetics note  5. Multigravida of advanced maternal age in second trimester Continue ASA  6. Decreased sensation Stable Patient [facility] 12-25 at [facility] for decreased sensation in right upper extremity.  Imaging showed no acute intracerebral process.  Referral was placed to neurology, patient has not heard from neurology to make an appointment.  Will replace today. - Ambulatory referral to Neurology   Preterm labor symptoms and general obstetric precautions including but not limited to vaginal bleeding, contractions, leaking of  fluid and fetal movement were reviewed in detail with the patient.  Please refer to After Visit Summary for other counseling recommendations.   Return in about 4 weeks (around 04/14/2024) for [facility]  Future Appointments  Date Time Provider Department Center  04/16/2024  2:00 PM [facility] PROVIDER 1 [facility]  04/16/2024  2:30 PM [facility]    Jorene FORBES Moats, PA-C

## 2024-03-17 NOTE — Progress Notes (Signed)
 Pt presents for rob. Pt is having abdominal and back pain. Pt has questions about her iron prescription.

## 2024-03-19 LAB — CYTOLOGY - PAP
Comment: NEGATIVE
Diagnosis: NEGATIVE
High risk HPV: NEGATIVE

## 2024-03-22 ENCOUNTER — Ambulatory Visit (HOSPITAL_COMMUNITY): Payer: Self-pay | Admitting: Physician Assistant

## 2024-04-14 ENCOUNTER — Ambulatory Visit: Admitting: Obstetrics and Gynecology

## 2024-04-14 VITALS — BP 95/60 | HR 76 | Wt 114.0 lb

## 2024-04-14 DIAGNOSIS — Z3480 Encounter for supervision of other normal pregnancy, unspecified trimester: Secondary | ICD-10-CM

## 2024-04-14 DIAGNOSIS — Q632 Ectopic kidney: Secondary | ICD-10-CM

## 2024-04-14 DIAGNOSIS — Z3A21 21 weeks gestation of pregnancy: Secondary | ICD-10-CM

## 2024-04-14 DIAGNOSIS — O09522 Supervision of elderly multigravida, second trimester: Secondary | ICD-10-CM | POA: Diagnosis not present

## 2024-04-14 DIAGNOSIS — D563 Thalassemia minor: Secondary | ICD-10-CM | POA: Diagnosis not present

## 2024-04-14 NOTE — Progress Notes (Signed)
 ROB, needs a different Rx for Iron.

## 2024-04-14 NOTE — Progress Notes (Addendum)
" ° °  PRENATAL VISIT NOTE  Subjective:  Stefanie Ortiz is a 39 y.o. G2P0010 at [redacted]w[redacted]d being seen today for ongoing prenatal care.  She is currently monitored for the following issues for this low-risk pregnancy and has Idiopathic scoliosis and kyphoscoliosis; Arm mass; Supervision of other normal pregnancy, antepartum; Thalassemia alpha carrier; Advanced maternal age in multigravida; Single pelvic kidney- Right; and Sensation disorder on their problem list.  Patient doing well with no acute concerns today. She reports mild swelling in left axilla.  Contractions: Not present. Vag. Bleeding: None.  Movement: Present. Denies leaking of fluid.   The following portions of the patient's history were reviewed and updated as appropriate: allergies, current medications, past family history, past medical history, past social history, past surgical history and problem list. Problem list updated.  Objective:   Vitals:   04/14/24 1552  BP: 95/60  Pulse: 76  Weight: 114 lb (51.7 kg)    Fetal Status: Fetal Heart Rate (bpm): 143 Fundal Height: 21 cm Movement: Present     General:  Alert, oriented and cooperative. Patient is in no acute distress.  Skin: Skin is warm and dry. No rash noted.   Cardiovascular: Normal heart rate noted  Respiratory: Normal respiratory effort, no problems with respiration noted  Abdomen: Soft, gravid, appropriate for gestational age.  Pain/Pressure: Absent     Pelvic: Cervical exam deferred        Extremities: Normal range of motion.  Edema: None  Mental Status:  Normal mood and affect. Normal behavior. Normal judgment and thought content.   Assessment and Plan:  Pregnancy: G2P0010 at [redacted]w[redacted]d  1. Supervision of other normal pregnancy, antepartum (Primary) Continue routine prenatal care Pt concerned regarding anemia Order placed for cbc before restarting oral iron, pt also needs lab only visit for AFP  Note given for [facility] regarding pt adding protein shakes to diet.  -  CBC; Future  2. [redacted] weeks gestation of pregnancy   3. Single pelvic kidney- Right   4. Thalassemia alpha carrier   5. Multigravida of advanced maternal age in second trimester   Preterm labor symptoms and general obstetric precautions including but not limited to vaginal bleeding, contractions, leaking of fluid and fetal movement were reviewed in detail with the patient.  Please refer to After Visit Summary for other counseling recommendations.   Return in about 4 weeks (around 05/12/2024) for ROB, in person.   Jerilynn Buddle, MD Faculty Attending Center for [facility]   "

## 2024-04-16 ENCOUNTER — Ambulatory Visit: Payer: Self-pay | Attending: Obstetrics and Gynecology | Admitting: Maternal & Fetal Medicine

## 2024-04-16 ENCOUNTER — Ambulatory Visit: Payer: Self-pay

## 2024-04-16 DIAGNOSIS — O2692 Pregnancy related conditions, unspecified, second trimester: Secondary | ICD-10-CM | POA: Diagnosis not present

## 2024-04-16 DIAGNOSIS — O3412 Maternal care for benign tumor of corpus uteri, second trimester: Secondary | ICD-10-CM

## 2024-04-16 DIAGNOSIS — Z3A21 21 weeks gestation of pregnancy: Secondary | ICD-10-CM | POA: Insufficient documentation

## 2024-04-16 DIAGNOSIS — O09522 Supervision of elderly multigravida, second trimester: Secondary | ICD-10-CM | POA: Insufficient documentation

## 2024-04-16 DIAGNOSIS — Z3480 Encounter for supervision of other normal pregnancy, unspecified trimester: Secondary | ICD-10-CM | POA: Diagnosis not present

## 2024-04-16 DIAGNOSIS — D259 Leiomyoma of uterus, unspecified: Secondary | ICD-10-CM | POA: Diagnosis not present

## 2024-04-16 DIAGNOSIS — Z363 Encounter for antenatal screening for malformations: Secondary | ICD-10-CM | POA: Diagnosis not present

## 2024-04-16 DIAGNOSIS — O99012 Anemia complicating pregnancy, second trimester: Secondary | ICD-10-CM | POA: Diagnosis not present

## 2024-04-16 DIAGNOSIS — Z148 Genetic carrier of other disease: Secondary | ICD-10-CM | POA: Diagnosis not present

## 2024-04-16 DIAGNOSIS — Z362 Encounter for other antenatal screening follow-up: Secondary | ICD-10-CM

## 2024-04-16 DIAGNOSIS — D563 Thalassemia minor: Secondary | ICD-10-CM

## 2024-04-16 NOTE — Progress Notes (Signed)
 "  Patient information  Patient Name: Stefanie Ortiz  Patient MRN:   [id number]  Referring practice: MFM Referring Provider: [facility]  Problem List   Patient Active Problem List   Diagnosis Date Noted   Single pelvic kidney- Right 03/09/2024   Sensation disorder 03/09/2024   Advanced maternal age in multigravida 02/26/2024   Thalassemia alpha carrier 02/19/2024   Supervision of other normal pregnancy, antepartum 02/06/2024   Arm mass 11/23/2012   Idiopathic scoliosis and kyphoscoliosis 06/27/2009   Maternal Fetal Medicine Consult Stefanie Ortiz is a 39 y.o. G2P0010 at [redacted]w[redacted]d here for ultrasound and consultation. She had low risk aneuploidy screening of a female fetus. Carrier screening was pos for alpha thal silient carrier. Maternal serum AFP was negative. She has no acute concerns.   Today we focused on the following:   The patient presents today for a detailed fetal anatomy ultrasound with transvaginal cervical length assessment due to advanced maternal age (38 years) and a known uterine fibroid. She has an anterior intramural fibroid measuring approximately 5 cm, as well as several additional Ortiz fibroids. These typically do not cause problems but have been associated with pain and fetal growth abnormalities. Serial growth US  will be done.   On transabdominal imaging, the cervix appeared shortened, prompting transvaginal evaluation. Transvaginal ultrasound demonstrated a cervical length ranging from 2.2 to 2.7 cm. We discussed that this measurement is near the threshold for a short cervix, which is defined as a cervical length less than 2.5 cm. The patient denies any symptoms of preterm labor, including pelvic pressure, vaginal bleeding, or leakage of fluid.  We reviewed that vaginal progesterone is typically recommended when the cervical length is persistently less than 2.5 cm. After discussion, the patient prefers expectant management with repeat cervical  length assessment in one week to confirm whether true cervical shortening is present. We discussed that factors such as uterine contractions, bladder filling, and fetal position can influence cervical length measurements. Given her lack of symptoms and borderline cervical length, this approach is reasonable. The patient understands that vaginal progesterone may be initiated at any time if desired and would be prescribed by her primary obstetric provider.  The patient is also a silent carrier for alpha thalassemia and declined additional counseling or further testing related to this finding.  RECOMMENDATIONS -Repeat transvaginal cervical length ultrasound in one week to reassess for true cervical shortening. -Continue expectant management at this time given borderline cervical length and absence of preterm labor symptoms. -Consider initiation of vaginal progesterone if cervical length is confirmed to be less than 2.5 cm or if the patient elects to start therapy sooner. -Monitor closely for signs or symptoms of preterm labor, including pelvic pressure, contractions, vaginal bleeding, or leakage of fluid, and seek prompt evaluation if these occur. -Continue routine prenatal care with primary obstetric provider. -Serial growth US  due to  -No further evaluation for alpha thalassemia carrier status per patient preference.  60 minutes of time was spent reviewing the patient's chart including labs, imaging and documentation.  At least 50% of this time was spent with direct patient care discussing the diagnosis, management and prognosis of her care.  Review of Systems: A review of systems was performed and was negative except per HPI   Past Obstetrical History:  OB History  Gravida Para Term Preterm AB Living  2 0 0 0 1 0  SAB IAB Ectopic Multiple Live Births  1 0 0 0 0    # Outcome Date  GA Lbr Len/2nd Weight Sex Type Anes PTL Lv  2 Current           1 SAB 2016     SAB        Past Medical  History:  Past Medical History:  Diagnosis Date   Anxiety    Depression    Fibroid uterus    Scoliosis    Thalassemia alpha carrier 02/19/2024     Past Surgical History:    Past Surgical History:  Procedure Laterality Date   LIPOMA EXCISION     Right shoulder, left lower back during same surgery     Home Medications:   Medications Ordered Prior to Encounter[1]    Allergies:   Allergies[2]   Physical Exam:   There were no vitals filed for this visit. Sitting comfortably on the sonogram table Nonlabored breathing Normal rate and rhythm Abdomen is nontender  Thank you for the opportunity to be involved with this patient's care. Please let us  know if we can be of any further assistance.   Stefanie Ortiz MFM, [place]   04/16/2024  4:30 PM      [1]  Current Outpatient Medications on File Prior to Visit  Medication Sig Dispense Refill   Prenatal Vit-Fe Fumarate-FA (MULTIVITAMIN-PRENATAL) 27-0.8 MG TABS tablet Take 1 tablet by mouth daily at 12 noon.     sertraline (ZOLOFT) 50 MG tablet Take 50 mg by mouth daily. (Patient not taking: Reported on 02/06/2024)     No current facility-administered medications on file prior to visit.  [2] No Known Allergies  "

## 2024-04-19 ENCOUNTER — Other Ambulatory Visit: Payer: Self-pay

## 2024-04-19 DIAGNOSIS — Z3480 Encounter for supervision of other normal pregnancy, unspecified trimester: Secondary | ICD-10-CM

## 2024-04-19 NOTE — Addendum Note (Signed)
 Addended by: DEIDRA Ferguson Gertner C on: 04/19/2024 04:14 PM   Modules accepted: Orders

## 2024-04-21 LAB — AFP, SERUM, OPEN SPINA BIFIDA
AFP MoM: 1.73
AFP Value: 158.5 ng/mL
Gest. Age on Collection Date: 21.6 wk
Maternal Age At EDD: 38.1 a
OSBR Risk 1 IN: 3028
Test Results:: NEGATIVE
Weight: 114 [lb_av]

## 2024-04-21 LAB — CBC
Hematocrit: 32.7 % — ABNORMAL LOW (ref 34.0–46.6)
Hemoglobin: 10.1 g/dL — ABNORMAL LOW (ref 11.1–15.9)
MCH: 23.8 pg — ABNORMAL LOW (ref 26.6–33.0)
MCHC: 30.9 g/dL — ABNORMAL LOW (ref 31.5–35.7)
MCV: 77 fL — ABNORMAL LOW (ref 79–97)
Platelets: 231 x10E3/uL (ref 150–450)
RBC: 4.25 x10E6/uL (ref 3.77–5.28)
RDW: 13.6 % (ref 11.7–15.4)
WBC: 10.4 x10E3/uL (ref 3.4–10.8)

## 2024-04-22 ENCOUNTER — Ambulatory Visit: Payer: Self-pay | Admitting: Obstetrics and Gynecology

## 2024-04-23 ENCOUNTER — Ambulatory Visit: Attending: Maternal & Fetal Medicine

## 2024-04-23 ENCOUNTER — Ambulatory Visit

## 2024-04-23 VITALS — BP 113/54 | HR 75

## 2024-04-23 DIAGNOSIS — O3412 Maternal care for benign tumor of corpus uteri, second trimester: Secondary | ICD-10-CM | POA: Diagnosis not present

## 2024-04-23 DIAGNOSIS — O289 Unspecified abnormal findings on antenatal screening of mother: Secondary | ICD-10-CM

## 2024-04-23 DIAGNOSIS — O2692 Pregnancy related conditions, unspecified, second trimester: Secondary | ICD-10-CM | POA: Insufficient documentation

## 2024-04-23 DIAGNOSIS — Z3A22 22 weeks gestation of pregnancy: Secondary | ICD-10-CM

## 2024-04-23 DIAGNOSIS — Z3686 Encounter for antenatal screening for cervical length: Secondary | ICD-10-CM | POA: Diagnosis not present

## 2024-04-23 DIAGNOSIS — D259 Leiomyoma of uterus, unspecified: Secondary | ICD-10-CM | POA: Diagnosis not present

## 2024-04-23 DIAGNOSIS — Z3480 Encounter for supervision of other normal pregnancy, unspecified trimester: Secondary | ICD-10-CM | POA: Diagnosis present

## 2024-04-23 DIAGNOSIS — O3410 Maternal care for benign tumor of corpus uteri, unspecified trimester: Secondary | ICD-10-CM | POA: Insufficient documentation

## 2024-04-23 DIAGNOSIS — O09522 Supervision of elderly multigravida, second trimester: Secondary | ICD-10-CM

## 2024-04-23 NOTE — Progress Notes (Signed)
 "  Patient information  Patient Name: Stefanie Ortiz  Patient MRN:   [id number]  Referring practice: MFM Referring Provider: [facility]  Problem List   Patient Active Problem List   Diagnosis Date Noted   Uterine fibroid in pregnancy 04/23/2024   Single pelvic kidney- Right 03/09/2024   Sensation disorder 03/09/2024   Advanced maternal age in multigravida 02/26/2024   Thalassemia alpha carrier 02/19/2024   Supervision of other normal pregnancy, antepartum 02/06/2024   Arm mass 11/23/2012   Idiopathic scoliosis and kyphoscoliosis 06/27/2009    Maternal Fetal medicine Consult  MIKELE SIFUENTES is a 39 y.o. G2P0010 at [redacted]w[redacted]d here for ultrasound and consultation. Stefanie Ortiz is doing well today with no acute concerns. Today we focused on the following:   The patient is here for a follow-up cervical length ultrasound after a previously noted dynamic cervix on imaging one week ago. On todays examination, the cervical imaging is of high quality, and the cervical length measures approximately 3.4 cm, which is within the normal range. She denies contractions, vaginal bleeding, or loss of fluid and has no symptoms concerning for preterm labor. I reassured her that these findings are very reassuring, and no further cervical length surveillance is needed at this time.  A uterine fibroid is again visualized, measuring approximately 5 cm. The patient denies abdominal pain or fibroid-related symptoms. Given the presence of this fibroid and her advanced maternal age of 39, we discussed the plan for serial growth ultrasounds during the pregnancy.  She will return in two weeks as previously scheduled.  RECOMMENDATIONS -No further cervical length ultrasounds indicated at this time -Reassure regarding normal cervical length and low concern for preterm labor -Continue serial growth ultrasounds due to uterine fibroid and advanced maternal age -Monitor for abdominal pain,  contractions, bleeding, or loss of fluid -Return for follow-up in two weeks as scheduled -Continue routine prenatal care with OB provider  The patient had time to ask questions that were answered to her satisfaction.  She verbalized understanding and agrees to proceed with the plan above.   I spent 30 minutes reviewing the patients chart, including labs and images as well as counseling the patient about her medical conditions. Greater than 50% of the time was spent in direct face-to-face patient counseling.  Delora Smaller  MFM, [place]   04/23/2024  2:11 PM   Review of Systems: A review of systems was performed and was negative except per HPI   Vitals and Physical Exam    04/23/2024    9:39 AM 04/14/2024    3:52 PM 03/17/2024    3:36 PM  Vitals with BMI  Weight  114 lbs 110 lbs 14 oz  BMI   19.95  Systolic 113 95 105  Diastolic 54 60 64  Pulse 75 76 74    Sitting comfortably on the sonogram table Nonlabored breathing Normal rate and rhythm Abdomen is nontender  Past pregnancies OB History  Gravida Para Term Preterm AB Living  2 0 0 0 1 0  SAB IAB Ectopic Multiple Live Births  1 0 0 0 0    # Outcome Date GA Lbr Len/2nd Weight Sex Type Anes PTL Lv  2 Current           1 SAB 2016     SAB        Future Appointments  Date Time Provider Department Center  05/12/2024 11:00 AM Margaret Eduard SAUNDERS, MD [facility] None  05/13/2024  2:50 PM  Abigail Rollo DASEN, MD [facility] None  05/14/2024  3:15 PM [facility] PROVIDER 1 [facility]  05/14/2024  3:30 PM [facility]2 [facility]      "

## 2024-05-12 ENCOUNTER — Encounter: Payer: Self-pay | Admitting: Diagnostic Neuroimaging

## 2024-05-12 ENCOUNTER — Ambulatory Visit: Payer: Self-pay | Admitting: Diagnostic Neuroimaging

## 2024-05-12 VITALS — BP 93/52 | HR 71 | Ht 63.0 in | Wt 124.4 lb

## 2024-05-12 DIAGNOSIS — R200 Anesthesia of skin: Secondary | ICD-10-CM

## 2024-05-12 DIAGNOSIS — R202 Paresthesia of skin: Secondary | ICD-10-CM

## 2024-05-12 NOTE — Patient Instructions (Signed)
" °  MILD INTERMITTENT RIGHT HAND / ARM NUMBNESS - suspect mild peripheral nerve irritation (carpal tunnel syndrome, ulnar neuropathy) - symptoms spontaneously improving; monitor for now  MRI BRAIN FINDINGS - non-specific gliosis; likely incidental findings; monitor for now "

## 2024-05-12 NOTE — Progress Notes (Signed)
 "  [facility]  PATIENT: Stefanie Ortiz DOB: 02/14/1986  REFERRING CLINICIAN: Eldonna Suzen Octave, MD HISTORY FROM: patient  REASON FOR VISIT: new consult   HISTORICAL  CHIEF COMPLAINT:  Chief Complaint  Patient presents with   RM 6     Patient is here alone  for decreased sensation in right arm - went to the ER she has a MRI scan that came back with concerning results.  Patient is currently [redacted] weeks pregnant     HISTORY OF PRESENT ILLNESS:   39 year old female, currently [redacted] weeks pregnant, here for evaluation of numbness and tingling in the right arm.  Symptoms started when she was [redacted] weeks pregnant.  She had onset of right arm numbness radiating from her right shoulder down to her right hand.  The entire hand was numb.  She went to emergency room for evaluation.  MRI of the brain and cervical spine were unremarkable.  Symptoms continued for the next week or so and then started to subside.  Now symptoms are significantly improved but tend to fluctuate in a mild way.  No problems with left arm or lower extremities.  No problems with face vision speech or swallowing.  Has had some itchy sensation on her upper back.   REVIEW OF SYSTEMS: Full 14 system review of systems performed and negative with exception of: as per HPI.  ALLERGIES: Allergies[1]  HOME MEDICATIONS: Outpatient Medications Prior to Visit  Medication Sig Dispense Refill   Prenatal Vit-Fe Fumarate-FA (MULTIVITAMIN-PRENATAL) 27-0.8 MG TABS tablet Take 1 tablet by mouth daily at 12 noon.     sertraline (ZOLOFT) 50 MG tablet Take 50 mg by mouth daily. (Patient not taking: Reported on 05/12/2024)     No facility-administered medications prior to visit.    PAST MEDICAL HISTORY: Past Medical History:  Diagnosis Date   Anxiety    Depression    Fibroid uterus    Scoliosis    Thalassemia alpha carrier 02/19/2024    PAST SURGICAL HISTORY: Past Surgical History:  Procedure Laterality Date    LIPOMA EXCISION     Right shoulder, left lower back during same surgery    FAMILY HISTORY: Family History  Problem Relation Age of Onset   Heart Problems Mother    Prostate cancer Father    Diabetes Other    Hypertension Other    Stroke Other    Migraines Neg Hx    Seizures Neg Hx    Sleep apnea Neg Hx     SOCIAL HISTORY: Social History   Socioeconomic History   Marital status: Single    Spouse name: Not on file   Number of children: Not on file   Years of education: Not on file   Highest education level: Not on file  Occupational History   Not on file  Tobacco Use   Smoking status: Former   Smokeless tobacco: Never  Vaping Use   Vaping status: Never Used  Substance and Sexual Activity   Alcohol use: Not Currently    Comment: rare   Drug use: No   Sexual activity: Yes  Other Topics Concern   Not on file  Social History Narrative   Some caffeine - 2 -3 times a week drinks tea    Social Drivers of Health   Tobacco Use: Medium Risk (05/12/2024)   Patient History    Smoking Tobacco Use: Former    Smokeless Tobacco Use: Never    Passive Exposure: Not on Actuary Strain:  Not on file  Food Insecurity: Food Insecurity Present (01/13/2024)   Epic    Worried About Programme Researcher, Broadcasting/film/video in the Last Year: Sometimes true    Ran Out of Food in the Last Year: Never true  Transportation Needs: No Transportation Needs (01/13/2024)   Epic    Lack of Transportation (Medical): No    Lack of Transportation (Non-Medical): No  Physical Activity: Not on file  Stress: Not on file  Social Connections: Not on file  Intimate Partner Violence: Not on file  Depression [phone]): Low Risk (02/06/2024)   Depression (PHQ2-9)    PHQ-2 Score: 3  Recent Concern: Depression (PHQ2-9) - Medium Risk (01/13/2024)   Depression (PHQ2-9)    PHQ-2 Score: 9  Alcohol Screen: Not on file  Housing: Not on file  Utilities: Not on file  Health Literacy: Not on file     PHYSICAL  EXAM  GENERAL EXAM/CONSTITUTIONAL: Vitals:  Vitals:   05/12/24 1107  BP: (!) 93/52  Pulse: 71  SpO2: 99%  Weight: 124 lb 6.4 oz (56.4 kg)  Height: 5' 3 (1.6 m)   Body mass index is 22.04 kg/m. Wt Readings from Last 3 Encounters:  05/12/24 124 lb 6.4 oz (56.4 kg)  04/14/24 114 lb (51.7 kg)  03/17/24 110 lb 14.4 oz (50.3 kg)   Patient is in no distress; well developed, nourished and groomed; neck is supple  CARDIOVASCULAR: Examination of carotid arteries is normal; no carotid bruits Regular rate and rhythm, no murmurs Examination of peripheral vascular system by observation and palpation is normal  EYES: Ophthalmoscopic exam of optic discs and posterior segments is normal; no papilledema or hemorrhages No results found.  MUSCULOSKELETAL: Gait, strength, tone, movements noted in Neurologic exam below  NEUROLOGIC: MENTAL STATUS:      No data to display         awake, alert, oriented to person, place and time recent and remote memory intact normal attention and concentration language fluent, comprehension intact, naming intact fund of knowledge appropriate  CRANIAL NERVE:  2nd - no papilledema on fundoscopic exam 2nd, 3rd, 4th, 6th - pupils equal and reactive to light, visual fields full to confrontation, extraocular muscles intact, no nystagmus 5th - facial sensation symmetric 7th - facial strength symmetric 8th - hearing intact 9th - palate elevates symmetrically, uvula midline 11th - shoulder shrug symmetric 12th - tongue protrusion midline  MOTOR:  normal bulk and tone, full strength in the BUE, BLE  SENSORY:  normal and symmetric to light touch, temperature, vibration; EXCEPT SLIGHTLY DECREASED IN RIGHT HAND TO PP PHALENS NEGATIVE; TINELS NEGATIVE   COORDINATION:  finger-nose-finger, fine finger movements normal  REFLEXES:  deep tendon reflexes 1+ and symmetric  GAIT/STATION:  narrow based gait     DIAGNOSTIC DATA (LABS, IMAGING,  TESTING) - I reviewed patient records, labs, notes, testing and imaging myself where available.  Lab Results  Component Value Date   WBC 10.4 04/19/2024   HGB 10.1 (L) 04/19/2024   HCT 32.7 (L) 04/19/2024   MCV 77 (L) 04/19/2024   PLT 231 04/19/2024      Component Value Date/Time   NA 137 11/29/2016 2208   K 3.9 11/29/2016 2208   CL 105 11/29/2016 2208   CO2 27 11/29/2016 2208   GLUCOSE 99 11/29/2016 2208   BUN 11 11/29/2016 2208   CREATININE 0.78 11/29/2016 2208   CALCIUM 9.1 11/29/2016 2208   PROT 7.8 11/29/2016 2208   ALBUMIN 3.8 11/29/2016 2208   AST 40 11/29/2016  2208   ALT 36 11/29/2016 2208   ALKPHOS 119 11/29/2016 2208   BILITOT 0.5 11/29/2016 2208   GFRNONAA >60 11/29/2016 2208   GFRAA >60 11/29/2016 2208   No results found for: CHOL, HDL, LDLCALC, LDLDIRECT, TRIG, CHOLHDL Lab Results  Component Value Date   HGBA1C 5.5 02/06/2024   No results found for: [id number] Lab Results  Component Value Date   TSH  06/19/2009    1.704 (NOTE)  Please note change in reference ranges for ages 74W to 30Y.  Test methodology is 3rd generation TSH    03/09/24 MRI brain [I reviewed images myself. Non-specific gliosis, likely incidental finding. -VRP]  1. No acute intracranial abnormality. 2. Scattered punctate T2 hyperintensities in the cerebral white matter, mild but abnormal for age and nonspecific. Considerations include premature chronic small vessel ischemic disease, complicated migraines, vasculitis, demyelinating disease, and sequelae of previous infection/inflammation.  03/09/24 MRI cervical spine 1. Grossly normal appearance of the cervical spinal cord on this motion-degraded examination. 2. Mild cervical spondylosis without stenosis.    ASSESSMENT AND PLAN  39 y.o. year old female here with:   Dx:  1. Numbness and tingling in right hand   2. Right arm numbness     PLAN:  MILD INTERMITTENT RIGHT HAND / ARM NUMBNESS - suspect mild  peripheral nerve irritation (carpal tunnel syndrome, ulnar neuropathy; likely related to pregnancy state and ergonomics) - symptoms spontaneously improving; monitor for now  MRI BRAIN FINDINGS - non-specific gliosis; likely incidental findings; monitor for now  No orders of the defined types were placed in this encounter.   No orders of the defined types were placed in this encounter.   Return for pending if symptoms worsen or fail to improve, return to referring provider.    EDUARD FABIENE HANLON, MD 05/12/2024, 11:41 AM Certified in Neurology, Neurophysiology and Neuroimaging  [facility] [address] [place], [place] [address] [phone]     [1] No Known Allergies  "

## 2024-05-13 ENCOUNTER — Encounter: Payer: Self-pay | Admitting: Obstetrics and Gynecology

## 2024-05-13 ENCOUNTER — Ambulatory Visit: Payer: Self-pay | Admitting: Obstetrics and Gynecology

## 2024-05-13 VITALS — BP 96/58 | HR 74 | Wt 124.0 lb

## 2024-05-13 DIAGNOSIS — O26892 Other specified pregnancy related conditions, second trimester: Secondary | ICD-10-CM

## 2024-05-13 DIAGNOSIS — Z3480 Encounter for supervision of other normal pregnancy, unspecified trimester: Secondary | ICD-10-CM

## 2024-05-13 DIAGNOSIS — Z23 Encounter for immunization: Secondary | ICD-10-CM

## 2024-05-13 NOTE — Progress Notes (Signed)
 "  PRENATAL VISIT NOTE  Subjective:  Stefanie Ortiz is a 39 y.o. G2P0010 at [redacted]w[redacted]d being seen today for ongoing prenatal care.  She is currently monitored for the following issues for this low-risk pregnancy and has Idiopathic scoliosis and kyphoscoliosis; Arm mass; Supervision of other normal pregnancy, antepartum; Thalassemia alpha carrier; Advanced maternal age in multigravida; Single pelvic kidney- Right; Sensation disorder; and Uterine fibroid in pregnancy on their problem list.  Patient reports pain in her pubic bone during certain movements like getting out of bed, putting on pants, etc.  Contractions: Not present. Vag. Bleeding: None.  Movement: Present. Denies leaking of fluid.   The following portions of the patient's history were reviewed and updated as appropriate: allergies, current medications, past family history, past medical history, past social history, past surgical history and problem list.   Objective:   Vitals:   05/13/24 1442  BP: (!) 96/58  Pulse: 74  Weight: 124 lb (56.2 kg)    Fetal Status:  Fetal Heart Rate (bpm): 142 Fundal Height: 25 cm Movement: Present    General: Alert, oriented and cooperative. Patient is in no acute distress.  Skin: Skin is warm and dry. No rash noted.   Cardiovascular: Normal heart rate noted  Respiratory: Normal respiratory effort, no problems with respiration noted  Abdomen: Soft, gravid, appropriate for gestational age.  Pain/Pressure: Present     Pelvic: Cervical exam deferred        Extremities: Normal range of motion.  Edema: None  Mental Status: Normal mood and affect. Normal behavior. Normal judgment and thought content.      02/06/2024    9:00 AM 01/13/2024    4:14 PM 09/17/2017    9:47 AM  Depression screen PHQ 2/9  Decreased Interest 0 1 0  Down, Depressed, Hopeless 0 1 0  PHQ - 2 Score 0 2 0  Altered sleeping 0 2   Tired, decreased energy 0 2   Change in appetite 2 1   Feeling bad or failure about yourself  0  1   Trouble concentrating 1 1   Moving slowly or fidgety/restless 0 0   Suicidal thoughts 0 0   PHQ-9 Score 3  9       Data saved with a previous flowsheet row definition        02/06/2024    9:03 AM 01/13/2024    4:15 PM  GAD 7 : Generalized Anxiety Score  Nervous, Anxious, on Edge 1  1   Control/stop worrying 1  1   Worry too much - different things 1  1   Trouble relaxing 1  1   Restless 0  0   Easily annoyed or irritable 0  1   Afraid - awful might happen 0  2   Total GAD 7 Score 4 7     Data saved with a previous flowsheet row definition    Assessment and Plan:  Pregnancy: G2P0010 at [redacted]w[redacted]d 1. Supervision of other normal pregnancy, antepartum (Primary) Anticipatory guidance  Flu shot today Third trimester labs reviewed for next visit  2. Pelvic pain affecting pregnancy in second trimester, antepartum Symptoms classic for pubic symphysis pain due to normal physiologic changes during pregnancy. Reviewed option for PT, patient is interested in this, referral made - Ambulatory referral to Physical Therapy  3. Flu vaccine need - Flu vaccine trivalent PF, 6mos and older(Flulaval,Afluria,Fluarix,Fluzone)   Preterm labor symptoms and general obstetric precautions including but not limited to vaginal bleeding, contractions, leaking of  fluid and fetal movement were reviewed in detail with the patient. Please refer to After Visit Summary for other counseling recommendations.   Return in about 4 weeks (around 06/10/2024) for GTT.  Future Appointments  Date Time Provider Department Center  05/19/2024  3:15 PM [facility] PROVIDER 1 [facility]  05/19/2024  3:30 PM [facility] US5 [facility]    Rollo ONEIDA Bring, MD  "

## 2024-05-13 NOTE — Progress Notes (Signed)
 Pt presents for ROB visit. Pt c/o pelvic pressure. Pt was seen at [facility].

## 2024-05-14 ENCOUNTER — Other Ambulatory Visit

## 2024-05-14 ENCOUNTER — Ambulatory Visit

## 2024-05-19 ENCOUNTER — Ambulatory Visit

## 2024-06-11 ENCOUNTER — Other Ambulatory Visit: Payer: Self-pay

## 2024-06-11 ENCOUNTER — Encounter: Payer: Self-pay | Admitting: Obstetrics & Gynecology
